# Patient Record
Sex: Male | Born: 1950 | Race: Black or African American | Hispanic: No | Marital: Married | Smoking: Former smoker
Health system: Southern US, Community
[De-identification: ages and names within clinical notes are randomized; demographics above are authoritative.]

## PROBLEM LIST (undated history)

## (undated) DIAGNOSIS — Z87442 Personal history of urinary calculi: Secondary | ICD-10-CM

## (undated) DIAGNOSIS — I10 Essential (primary) hypertension: Secondary | ICD-10-CM

## (undated) DIAGNOSIS — N200 Calculus of kidney: Secondary | ICD-10-CM

## (undated) DIAGNOSIS — N189 Chronic kidney disease, unspecified: Secondary | ICD-10-CM

## (undated) DIAGNOSIS — M479 Spondylosis, unspecified: Secondary | ICD-10-CM

## (undated) DIAGNOSIS — D126 Benign neoplasm of colon, unspecified: Secondary | ICD-10-CM

## (undated) DIAGNOSIS — C160 Malignant neoplasm of cardia: Secondary | ICD-10-CM

## (undated) DIAGNOSIS — E785 Hyperlipidemia, unspecified: Secondary | ICD-10-CM

## (undated) DIAGNOSIS — K219 Gastro-esophageal reflux disease without esophagitis: Secondary | ICD-10-CM

## (undated) HISTORY — DX: Malignant neoplasm of cardia: C16.0

## (undated) HISTORY — DX: Essential (primary) hypertension: I10

## (undated) HISTORY — DX: Spondylosis, unspecified: M47.9

## (undated) HISTORY — DX: Calculus of kidney: N20.0

## (undated) HISTORY — PX: KNEE SURGERY: SHX244

## (undated) HISTORY — DX: Benign neoplasm of colon, unspecified: D12.6

## (undated) HISTORY — DX: Hyperlipidemia, unspecified: E78.5

---

## 2002-03-11 ENCOUNTER — Ambulatory Visit (HOSPITAL_COMMUNITY): Admission: RE | Admit: 2002-03-11 | Discharge: 2002-03-11 | Payer: Self-pay | Admitting: *Deleted

## 2002-03-11 ENCOUNTER — Encounter (INDEPENDENT_AMBULATORY_CARE_PROVIDER_SITE_OTHER): Payer: Self-pay | Admitting: Specialist

## 2002-03-20 ENCOUNTER — Ambulatory Visit (HOSPITAL_COMMUNITY): Admission: RE | Admit: 2002-03-20 | Discharge: 2002-03-20 | Payer: Self-pay | Admitting: Gastroenterology

## 2002-04-20 ENCOUNTER — Encounter (INDEPENDENT_AMBULATORY_CARE_PROVIDER_SITE_OTHER): Payer: Self-pay | Admitting: Specialist

## 2002-04-20 ENCOUNTER — Inpatient Hospital Stay (HOSPITAL_COMMUNITY): Admission: RE | Admit: 2002-04-20 | Discharge: 2002-04-28 | Payer: Self-pay | Admitting: General Surgery

## 2002-04-20 ENCOUNTER — Encounter: Payer: Self-pay | Admitting: General Surgery

## 2002-04-20 HISTORY — PX: ESOPHAGOGASTRECTOMY: SHX1528

## 2002-04-21 ENCOUNTER — Encounter: Payer: Self-pay | Admitting: General Surgery

## 2002-04-22 ENCOUNTER — Encounter: Payer: Self-pay | Admitting: Thoracic Surgery

## 2002-04-23 ENCOUNTER — Encounter: Payer: Self-pay | Admitting: Thoracic Surgery

## 2002-04-24 ENCOUNTER — Encounter: Payer: Self-pay | Admitting: Thoracic Surgery

## 2002-04-25 ENCOUNTER — Encounter: Payer: Self-pay | Admitting: Thoracic Surgery

## 2002-04-26 ENCOUNTER — Encounter: Payer: Self-pay | Admitting: General Surgery

## 2002-04-27 ENCOUNTER — Encounter: Payer: Self-pay | Admitting: Thoracic Surgery

## 2002-04-28 ENCOUNTER — Encounter: Payer: Self-pay | Admitting: Thoracic Surgery

## 2002-05-12 ENCOUNTER — Encounter: Admission: RE | Admit: 2002-05-12 | Discharge: 2002-05-12 | Payer: Self-pay | Admitting: Thoracic Surgery

## 2002-05-12 ENCOUNTER — Encounter: Payer: Self-pay | Admitting: Thoracic Surgery

## 2002-06-16 ENCOUNTER — Ambulatory Visit: Admission: RE | Admit: 2002-06-16 | Discharge: 2002-08-08 | Payer: Self-pay | Admitting: *Deleted

## 2002-07-22 ENCOUNTER — Encounter: Admission: RE | Admit: 2002-07-22 | Discharge: 2002-07-22 | Payer: Self-pay | Admitting: Thoracic Surgery

## 2002-07-22 ENCOUNTER — Encounter: Payer: Self-pay | Admitting: Thoracic Surgery

## 2002-10-16 ENCOUNTER — Ambulatory Visit (HOSPITAL_COMMUNITY): Admission: RE | Admit: 2002-10-16 | Discharge: 2002-10-16 | Payer: Self-pay | Admitting: *Deleted

## 2002-10-16 ENCOUNTER — Encounter: Payer: Self-pay | Admitting: *Deleted

## 2002-10-20 ENCOUNTER — Encounter: Payer: Self-pay | Admitting: Thoracic Surgery

## 2002-10-20 ENCOUNTER — Encounter: Admission: RE | Admit: 2002-10-20 | Discharge: 2002-10-20 | Payer: Self-pay | Admitting: Thoracic Surgery

## 2002-12-24 ENCOUNTER — Ambulatory Visit (HOSPITAL_COMMUNITY): Admission: RE | Admit: 2002-12-24 | Discharge: 2002-12-24 | Payer: Self-pay | Admitting: General Surgery

## 2002-12-24 ENCOUNTER — Encounter: Payer: Self-pay | Admitting: General Surgery

## 2002-12-30 ENCOUNTER — Ambulatory Visit (HOSPITAL_COMMUNITY): Admission: RE | Admit: 2002-12-30 | Discharge: 2002-12-30 | Payer: Self-pay | Admitting: *Deleted

## 2002-12-30 ENCOUNTER — Encounter (INDEPENDENT_AMBULATORY_CARE_PROVIDER_SITE_OTHER): Payer: Self-pay | Admitting: Specialist

## 2003-01-01 ENCOUNTER — Encounter: Payer: Self-pay | Admitting: *Deleted

## 2003-01-01 ENCOUNTER — Ambulatory Visit (HOSPITAL_COMMUNITY): Admission: RE | Admit: 2003-01-01 | Discharge: 2003-01-01 | Payer: Self-pay | Admitting: *Deleted

## 2003-04-28 ENCOUNTER — Encounter: Payer: Self-pay | Admitting: Family Medicine

## 2003-04-28 ENCOUNTER — Ambulatory Visit (HOSPITAL_COMMUNITY): Admission: RE | Admit: 2003-04-28 | Discharge: 2003-04-28 | Payer: Self-pay | Admitting: Family Medicine

## 2003-05-07 ENCOUNTER — Ambulatory Visit (HOSPITAL_COMMUNITY): Admission: RE | Admit: 2003-05-07 | Discharge: 2003-05-07 | Payer: Self-pay | Admitting: Family Medicine

## 2003-05-07 ENCOUNTER — Encounter: Payer: Self-pay | Admitting: Family Medicine

## 2003-05-20 ENCOUNTER — Ambulatory Visit (HOSPITAL_COMMUNITY): Admission: RE | Admit: 2003-05-20 | Discharge: 2003-05-20 | Payer: Self-pay | Admitting: Oncology

## 2003-05-20 ENCOUNTER — Encounter (HOSPITAL_COMMUNITY): Payer: Self-pay | Admitting: Oncology

## 2003-07-22 ENCOUNTER — Encounter (HOSPITAL_COMMUNITY): Payer: Self-pay | Admitting: Oncology

## 2003-07-22 ENCOUNTER — Ambulatory Visit (HOSPITAL_COMMUNITY): Admission: RE | Admit: 2003-07-22 | Discharge: 2003-07-22 | Payer: Self-pay | Admitting: Oncology

## 2004-01-12 ENCOUNTER — Ambulatory Visit (HOSPITAL_COMMUNITY): Admission: RE | Admit: 2004-01-12 | Discharge: 2004-01-12 | Payer: Self-pay | Admitting: Oncology

## 2004-01-21 ENCOUNTER — Ambulatory Visit (HOSPITAL_COMMUNITY): Admission: RE | Admit: 2004-01-21 | Discharge: 2004-01-21 | Payer: Self-pay | Admitting: *Deleted

## 2004-01-21 ENCOUNTER — Encounter (INDEPENDENT_AMBULATORY_CARE_PROVIDER_SITE_OTHER): Payer: Self-pay | Admitting: Specialist

## 2004-09-15 ENCOUNTER — Ambulatory Visit: Payer: Self-pay | Admitting: Oncology

## 2004-12-18 ENCOUNTER — Ambulatory Visit: Payer: Self-pay | Admitting: Oncology

## 2005-03-12 ENCOUNTER — Ambulatory Visit: Payer: Self-pay | Admitting: Oncology

## 2005-03-13 ENCOUNTER — Ambulatory Visit (HOSPITAL_COMMUNITY): Admission: RE | Admit: 2005-03-13 | Discharge: 2005-03-13 | Payer: Self-pay | Admitting: Oncology

## 2005-07-04 ENCOUNTER — Ambulatory Visit: Payer: Self-pay | Admitting: Oncology

## 2005-11-05 ENCOUNTER — Ambulatory Visit: Payer: Self-pay | Admitting: Oncology

## 2006-03-06 ENCOUNTER — Ambulatory Visit: Payer: Self-pay | Admitting: Oncology

## 2006-03-12 LAB — CBC WITH DIFFERENTIAL/PLATELET
BASO%: 1.1 % (ref 0.0–2.0)
Basophils Absolute: 0 10*3/uL (ref 0.0–0.1)
EOS%: 1.5 % (ref 0.0–7.0)
Eosinophils Absolute: 0 10*3/uL (ref 0.0–0.5)
HCT: 43.9 % (ref 38.7–49.9)
HGB: 15.2 g/dL (ref 13.0–17.1)
LYMPH%: 18.4 % (ref 14.0–48.0)
MCH: 31.2 pg (ref 28.0–33.4)
MCHC: 34.6 g/dL (ref 32.0–35.9)
MCV: 90.3 fL (ref 81.6–98.0)
MONO#: 0.4 10*3/uL (ref 0.1–0.9)
MONO%: 12 % (ref 0.0–13.0)
NEUT#: 2.3 10*3/uL (ref 1.5–6.5)
NEUT%: 67 % (ref 40.0–75.0)
Platelets: 230 10*3/uL (ref 145–400)
RBC: 4.87 10*6/uL (ref 4.20–5.71)
RDW: 13.9 % (ref 11.2–14.6)
WBC: 3.4 10*3/uL — ABNORMAL LOW (ref 4.0–10.0)
lymph#: 0.6 10*3/uL — ABNORMAL LOW (ref 0.9–3.3)

## 2006-03-12 LAB — COMPREHENSIVE METABOLIC PANEL
ALT: 14 U/L (ref 0–40)
AST: 18 U/L (ref 0–37)
Albumin: 4.3 g/dL (ref 3.5–5.2)
Alkaline Phosphatase: 92 U/L (ref 39–117)
BUN: 13 mg/dL (ref 6–23)
CO2: 27 mEq/L (ref 19–32)
Calcium: 8.9 mg/dL (ref 8.4–10.5)
Chloride: 103 mEq/L (ref 96–112)
Creatinine, Ser: 1.15 mg/dL (ref 0.40–1.50)
Glucose, Bld: 88 mg/dL (ref 70–99)
Potassium: 4.5 mEq/L (ref 3.5–5.3)
Sodium: 138 mEq/L (ref 135–145)
Total Bilirubin: 1.5 mg/dL — ABNORMAL HIGH (ref 0.3–1.2)
Total Protein: 6.2 g/dL (ref 6.0–8.3)

## 2006-03-12 LAB — LACTATE DEHYDROGENASE: LDH: 160 U/L (ref 94–250)

## 2006-03-19 ENCOUNTER — Ambulatory Visit (HOSPITAL_COMMUNITY): Admission: RE | Admit: 2006-03-19 | Discharge: 2006-03-19 | Payer: Self-pay | Admitting: Oncology

## 2006-04-03 ENCOUNTER — Ambulatory Visit (HOSPITAL_COMMUNITY): Admission: RE | Admit: 2006-04-03 | Discharge: 2006-04-03 | Payer: Self-pay | Admitting: Oncology

## 2006-04-23 ENCOUNTER — Encounter: Payer: Self-pay | Admitting: Gastroenterology

## 2006-07-12 ENCOUNTER — Ambulatory Visit: Payer: Self-pay | Admitting: Oncology

## 2006-10-08 DIAGNOSIS — C160 Malignant neoplasm of cardia: Secondary | ICD-10-CM

## 2006-10-08 HISTORY — DX: Malignant neoplasm of cardia: C16.0

## 2006-11-07 ENCOUNTER — Ambulatory Visit: Payer: Self-pay | Admitting: Oncology

## 2006-11-12 LAB — CBC WITH DIFFERENTIAL/PLATELET
BASO%: 0.6 % (ref 0.0–2.0)
Basophils Absolute: 0 10*3/uL (ref 0.0–0.1)
EOS%: 1.2 % (ref 0.0–7.0)
Eosinophils Absolute: 0 10*3/uL (ref 0.0–0.5)
HCT: 44.2 % (ref 38.7–49.9)
HGB: 15.6 g/dL (ref 13.0–17.1)
LYMPH%: 21.3 % (ref 14.0–48.0)
MCH: 31.4 pg (ref 28.0–33.4)
MCHC: 35.4 g/dL (ref 32.0–35.9)
MCV: 88.5 fL (ref 81.6–98.0)
MONO#: 0.3 10*3/uL (ref 0.1–0.9)
MONO%: 8.9 % (ref 0.0–13.0)
NEUT#: 2.4 10*3/uL (ref 1.5–6.5)
NEUT%: 68 % (ref 40.0–75.0)
Platelets: 218 10*3/uL (ref 145–400)
RBC: 4.99 10*6/uL (ref 4.20–5.71)
RDW: 13.2 % (ref 11.2–14.6)
WBC: 3.6 10*3/uL — ABNORMAL LOW (ref 4.0–10.0)
lymph#: 0.8 10*3/uL — ABNORMAL LOW (ref 0.9–3.3)

## 2006-11-12 LAB — COMPREHENSIVE METABOLIC PANEL
ALT: 12 U/L (ref 0–53)
AST: 13 U/L (ref 0–37)
Albumin: 4.5 g/dL (ref 3.5–5.2)
Alkaline Phosphatase: 90 U/L (ref 39–117)
BUN: 17 mg/dL (ref 6–23)
CO2: 24 mEq/L (ref 19–32)
Calcium: 9.6 mg/dL (ref 8.4–10.5)
Chloride: 103 mEq/L (ref 96–112)
Creatinine, Ser: 1.46 mg/dL (ref 0.40–1.50)
Glucose, Bld: 129 mg/dL — ABNORMAL HIGH (ref 70–99)
Potassium: 4 mEq/L (ref 3.5–5.3)
Sodium: 140 mEq/L (ref 135–145)
Total Bilirubin: 0.8 mg/dL (ref 0.3–1.2)
Total Protein: 6.8 g/dL (ref 6.0–8.3)

## 2006-11-12 LAB — LACTATE DEHYDROGENASE: LDH: 144 U/L (ref 94–250)

## 2007-04-04 ENCOUNTER — Ambulatory Visit: Payer: Self-pay | Admitting: Oncology

## 2007-04-08 ENCOUNTER — Ambulatory Visit (HOSPITAL_COMMUNITY): Admission: RE | Admit: 2007-04-08 | Discharge: 2007-04-08 | Payer: Self-pay | Admitting: Oncology

## 2007-04-08 LAB — COMPREHENSIVE METABOLIC PANEL
ALT: 13 U/L (ref 0–53)
AST: 15 U/L (ref 0–37)
Albumin: 4.7 g/dL (ref 3.5–5.2)
Alkaline Phosphatase: 86 U/L (ref 39–117)
BUN: 17 mg/dL (ref 6–23)
CO2: 29 mEq/L (ref 19–32)
Calcium: 9.5 mg/dL (ref 8.4–10.5)
Chloride: 102 mEq/L (ref 96–112)
Creatinine, Ser: 1.21 mg/dL (ref 0.40–1.50)
Glucose, Bld: 86 mg/dL (ref 70–99)
Potassium: 4 mEq/L (ref 3.5–5.3)
Sodium: 139 mEq/L (ref 135–145)
Total Bilirubin: 1.1 mg/dL (ref 0.3–1.2)
Total Protein: 6.8 g/dL (ref 6.0–8.3)

## 2007-04-08 LAB — CBC WITH DIFFERENTIAL/PLATELET
BASO%: 0.7 % (ref 0.0–2.0)
Basophils Absolute: 0 10*3/uL (ref 0.0–0.1)
EOS%: 1 % (ref 0.0–7.0)
Eosinophils Absolute: 0 10*3/uL (ref 0.0–0.5)
HCT: 45.5 % (ref 38.7–49.9)
HGB: 16 g/dL (ref 13.0–17.1)
LYMPH%: 18.8 % (ref 14.0–48.0)
MCH: 31.5 pg (ref 28.0–33.4)
MCHC: 35.2 g/dL (ref 32.0–35.9)
MCV: 89.6 fL (ref 81.6–98.0)
MONO#: 0.4 10*3/uL (ref 0.1–0.9)
MONO%: 11.3 % (ref 0.0–13.0)
NEUT#: 2.5 10*3/uL (ref 1.5–6.5)
NEUT%: 68.2 % (ref 40.0–75.0)
Platelets: 221 10*3/uL (ref 145–400)
RBC: 5.08 10*6/uL (ref 4.20–5.71)
RDW: 13.1 % (ref 11.2–14.6)
WBC: 3.7 10*3/uL — ABNORMAL LOW (ref 4.0–10.0)
lymph#: 0.7 10*3/uL — ABNORMAL LOW (ref 0.9–3.3)

## 2007-04-08 LAB — LACTATE DEHYDROGENASE: LDH: 152 U/L (ref 94–250)

## 2007-05-08 ENCOUNTER — Encounter: Payer: Self-pay | Admitting: Gastroenterology

## 2007-06-19 ENCOUNTER — Encounter: Payer: Self-pay | Admitting: Gastroenterology

## 2007-06-30 ENCOUNTER — Encounter: Payer: Self-pay | Admitting: Gastroenterology

## 2007-10-08 ENCOUNTER — Ambulatory Visit: Payer: Self-pay | Admitting: Oncology

## 2007-10-14 LAB — CBC WITH DIFFERENTIAL/PLATELET
BASO%: 1.6 % (ref 0.0–2.0)
Basophils Absolute: 0.1 10*3/uL (ref 0.0–0.1)
EOS%: 1.3 % (ref 0.0–7.0)
Eosinophils Absolute: 0.1 10*3/uL (ref 0.0–0.5)
HCT: 43.7 % (ref 38.7–49.9)
HGB: 15.4 g/dL (ref 13.0–17.1)
LYMPH%: 21 % (ref 14.0–48.0)
MCH: 31.5 pg (ref 28.0–33.4)
MCHC: 35.4 g/dL (ref 32.0–35.9)
MCV: 88.9 fL (ref 81.6–98.0)
MONO#: 0.4 10*3/uL (ref 0.1–0.9)
MONO%: 10.8 % (ref 0.0–13.0)
NEUT#: 2.6 10*3/uL (ref 1.5–6.5)
NEUT%: 65.3 % (ref 40.0–75.0)
Platelets: 237 10*3/uL (ref 145–400)
RBC: 4.91 10*6/uL (ref 4.20–5.71)
RDW: 13.4 % (ref 11.2–14.6)
WBC: 4 10*3/uL (ref 4.0–10.0)
lymph#: 0.9 10*3/uL (ref 0.9–3.3)

## 2007-10-14 LAB — COMPREHENSIVE METABOLIC PANEL
ALT: 20 U/L (ref 0–53)
AST: 17 U/L (ref 0–37)
Albumin: 4.6 g/dL (ref 3.5–5.2)
Alkaline Phosphatase: 92 U/L (ref 39–117)
BUN: 17 mg/dL (ref 6–23)
CO2: 26 mEq/L (ref 19–32)
Calcium: 9.6 mg/dL (ref 8.4–10.5)
Chloride: 103 mEq/L (ref 96–112)
Creatinine, Ser: 1.2 mg/dL (ref 0.40–1.50)
Glucose, Bld: 86 mg/dL (ref 70–99)
Potassium: 4.5 mEq/L (ref 3.5–5.3)
Sodium: 141 mEq/L (ref 135–145)
Total Bilirubin: 0.6 mg/dL (ref 0.3–1.2)
Total Protein: 6.9 g/dL (ref 6.0–8.3)

## 2007-10-14 LAB — LACTATE DEHYDROGENASE: LDH: 152 U/L (ref 94–250)

## 2007-10-14 LAB — PSA, MEDICARE: PSA: 1.56 ng/mL (ref 0.10–4.00)

## 2008-04-07 ENCOUNTER — Ambulatory Visit: Payer: Self-pay | Admitting: Oncology

## 2008-04-12 LAB — CBC WITH DIFFERENTIAL/PLATELET
BASO%: 0.6 % (ref 0.0–2.0)
Basophils Absolute: 0 10*3/uL (ref 0.0–0.1)
EOS%: 2.6 % (ref 0.0–7.0)
Eosinophils Absolute: 0.1 10*3/uL (ref 0.0–0.5)
HCT: 43.4 % (ref 38.7–49.9)
HGB: 15.4 g/dL (ref 13.0–17.1)
LYMPH%: 22.6 % (ref 14.0–48.0)
MCH: 31.1 pg (ref 28.0–33.4)
MCHC: 35.3 g/dL (ref 32.0–35.9)
MCV: 88.1 fL (ref 81.6–98.0)
MONO#: 0.4 10*3/uL (ref 0.1–0.9)
MONO%: 9.9 % (ref 0.0–13.0)
NEUT#: 2.3 10*3/uL (ref 1.5–6.5)
NEUT%: 64.3 % (ref 40.0–75.0)
Platelets: 227 10*3/uL (ref 145–400)
RBC: 4.93 10*6/uL (ref 4.20–5.71)
RDW: 13.7 % (ref 11.2–14.6)
WBC: 3.6 10*3/uL — ABNORMAL LOW (ref 4.0–10.0)
lymph#: 0.8 10*3/uL — ABNORMAL LOW (ref 0.9–3.3)

## 2008-04-12 LAB — COMPREHENSIVE METABOLIC PANEL
ALT: 15 U/L (ref 0–53)
AST: 17 U/L (ref 0–37)
Albumin: 4.4 g/dL (ref 3.5–5.2)
Alkaline Phosphatase: 80 U/L (ref 39–117)
BUN: 19 mg/dL (ref 6–23)
CO2: 26 mEq/L (ref 19–32)
Calcium: 9.3 mg/dL (ref 8.4–10.5)
Chloride: 103 mEq/L (ref 96–112)
Creatinine, Ser: 1.18 mg/dL (ref 0.40–1.50)
Glucose, Bld: 87 mg/dL (ref 70–99)
Potassium: 4.5 mEq/L (ref 3.5–5.3)
Sodium: 138 mEq/L (ref 135–145)
Total Bilirubin: 1 mg/dL (ref 0.3–1.2)
Total Protein: 6.4 g/dL (ref 6.0–8.3)

## 2008-04-12 LAB — LACTATE DEHYDROGENASE: LDH: 157 U/L (ref 94–250)

## 2008-04-21 ENCOUNTER — Ambulatory Visit (HOSPITAL_COMMUNITY): Admission: RE | Admit: 2008-04-21 | Discharge: 2008-04-21 | Payer: Self-pay | Admitting: Oncology

## 2008-04-22 ENCOUNTER — Encounter: Payer: Self-pay | Admitting: Gastroenterology

## 2008-04-26 ENCOUNTER — Encounter: Payer: Self-pay | Admitting: Gastroenterology

## 2008-04-29 ENCOUNTER — Encounter: Admission: RE | Admit: 2008-04-29 | Discharge: 2008-04-29 | Payer: Self-pay | Admitting: Gastroenterology

## 2008-04-29 ENCOUNTER — Encounter: Payer: Self-pay | Admitting: Gastroenterology

## 2008-05-12 ENCOUNTER — Encounter: Payer: Self-pay | Admitting: Gastroenterology

## 2008-05-18 ENCOUNTER — Encounter: Payer: Self-pay | Admitting: Gastroenterology

## 2008-05-19 ENCOUNTER — Encounter: Payer: Self-pay | Admitting: Gastroenterology

## 2008-05-19 DIAGNOSIS — Z8501 Personal history of malignant neoplasm of esophagus: Secondary | ICD-10-CM | POA: Insufficient documentation

## 2008-06-03 ENCOUNTER — Ambulatory Visit (HOSPITAL_COMMUNITY): Admission: RE | Admit: 2008-06-03 | Discharge: 2008-06-03 | Payer: Self-pay | Admitting: Gastroenterology

## 2008-06-03 ENCOUNTER — Ambulatory Visit: Payer: Self-pay | Admitting: Gastroenterology

## 2008-06-26 ENCOUNTER — Emergency Department: Payer: Self-pay | Admitting: Unknown Physician Specialty

## 2008-06-26 ENCOUNTER — Other Ambulatory Visit: Payer: Self-pay

## 2008-11-16 ENCOUNTER — Ambulatory Visit: Payer: Self-pay | Admitting: Oncology

## 2009-05-17 ENCOUNTER — Ambulatory Visit: Payer: Self-pay | Admitting: Oncology

## 2009-05-19 LAB — CBC WITH DIFFERENTIAL/PLATELET
BASO%: 1.4 % (ref 0.0–2.0)
Basophils Absolute: 0.1 10*3/uL (ref 0.0–0.1)
EOS%: 1.5 % (ref 0.0–7.0)
Eosinophils Absolute: 0.1 10*3/uL (ref 0.0–0.5)
HCT: 42.6 % (ref 38.4–49.9)
HGB: 14.7 g/dL (ref 13.0–17.1)
LYMPH%: 22.6 % (ref 14.0–49.0)
MCH: 31.5 pg (ref 27.2–33.4)
MCHC: 34.5 g/dL (ref 32.0–36.0)
MCV: 91.1 fL (ref 79.3–98.0)
MONO#: 0.4 10*3/uL (ref 0.1–0.9)
MONO%: 11.7 % (ref 0.0–14.0)
NEUT#: 2.2 10*3/uL (ref 1.5–6.5)
NEUT%: 62.8 % (ref 39.0–75.0)
Platelets: 213 10*3/uL (ref 140–400)
RBC: 4.68 10*6/uL (ref 4.20–5.82)
RDW: 13.6 % (ref 11.0–14.6)
WBC: 3.5 10*3/uL — ABNORMAL LOW (ref 4.0–10.3)
lymph#: 0.8 10*3/uL — ABNORMAL LOW (ref 0.9–3.3)

## 2009-05-19 LAB — LACTATE DEHYDROGENASE: LDH: 176 U/L (ref 94–250)

## 2009-05-19 LAB — COMPREHENSIVE METABOLIC PANEL
ALT: 16 U/L (ref 0–53)
AST: 16 U/L (ref 0–37)
Albumin: 4.2 g/dL (ref 3.5–5.2)
Alkaline Phosphatase: 79 U/L (ref 39–117)
BUN: 16 mg/dL (ref 6–23)
CO2: 25 mEq/L (ref 19–32)
Calcium: 8.7 mg/dL (ref 8.4–10.5)
Chloride: 109 mEq/L (ref 96–112)
Creatinine, Ser: 1.27 mg/dL (ref 0.40–1.50)
Glucose, Bld: 95 mg/dL (ref 70–99)
Potassium: 4.3 mEq/L (ref 3.5–5.3)
Sodium: 142 mEq/L (ref 135–145)
Total Bilirubin: 1 mg/dL (ref 0.3–1.2)
Total Protein: 6.5 g/dL (ref 6.0–8.3)

## 2009-11-16 ENCOUNTER — Ambulatory Visit: Payer: Self-pay | Admitting: Oncology

## 2009-11-18 LAB — CBC WITH DIFFERENTIAL/PLATELET
BASO%: 0.6 % (ref 0.0–2.0)
Basophils Absolute: 0 10*3/uL (ref 0.0–0.1)
EOS%: 1.3 % (ref 0.0–7.0)
Eosinophils Absolute: 0.1 10*3/uL (ref 0.0–0.5)
HCT: 44.7 % (ref 38.4–49.9)
HGB: 15.4 g/dL (ref 13.0–17.1)
LYMPH%: 22.9 % (ref 14.0–49.0)
MCH: 31.7 pg (ref 27.2–33.4)
MCHC: 34.4 g/dL (ref 32.0–36.0)
MCV: 92.3 fL (ref 79.3–98.0)
MONO#: 0.4 10*3/uL (ref 0.1–0.9)
MONO%: 9.9 % (ref 0.0–14.0)
NEUT#: 2.6 10*3/uL (ref 1.5–6.5)
NEUT%: 65.3 % (ref 39.0–75.0)
Platelets: 215 10*3/uL (ref 140–400)
RBC: 4.84 10*6/uL (ref 4.20–5.82)
RDW: 13.9 % (ref 11.0–14.6)
WBC: 4 10*3/uL (ref 4.0–10.3)
lymph#: 0.9 10*3/uL (ref 0.9–3.3)

## 2009-11-18 LAB — COMPREHENSIVE METABOLIC PANEL
ALT: 19 U/L (ref 0–53)
AST: 19 U/L (ref 0–37)
Albumin: 4.5 g/dL (ref 3.5–5.2)
Alkaline Phosphatase: 85 U/L (ref 39–117)
BUN: 12 mg/dL (ref 6–23)
CO2: 27 mEq/L (ref 19–32)
Calcium: 9.2 mg/dL (ref 8.4–10.5)
Chloride: 104 mEq/L (ref 96–112)
Creatinine, Ser: 1.24 mg/dL (ref 0.40–1.50)
Glucose, Bld: 91 mg/dL (ref 70–99)
Potassium: 4.5 mEq/L (ref 3.5–5.3)
Sodium: 139 mEq/L (ref 135–145)
Total Bilirubin: 0.9 mg/dL (ref 0.3–1.2)
Total Protein: 6.6 g/dL (ref 6.0–8.3)

## 2009-11-18 LAB — LACTATE DEHYDROGENASE: LDH: 150 U/L (ref 94–250)

## 2009-12-14 ENCOUNTER — Ambulatory Visit (HOSPITAL_COMMUNITY): Admission: RE | Admit: 2009-12-14 | Discharge: 2009-12-14 | Payer: Self-pay | Admitting: Gastroenterology

## 2010-04-13 ENCOUNTER — Ambulatory Visit (HOSPITAL_BASED_OUTPATIENT_CLINIC_OR_DEPARTMENT_OTHER): Admission: RE | Admit: 2010-04-13 | Discharge: 2010-04-13 | Payer: Self-pay | Admitting: General Surgery

## 2010-05-16 ENCOUNTER — Ambulatory Visit: Payer: Self-pay | Admitting: Oncology

## 2010-05-18 ENCOUNTER — Ambulatory Visit (HOSPITAL_COMMUNITY): Admission: RE | Admit: 2010-05-18 | Discharge: 2010-05-18 | Payer: Self-pay | Admitting: Oncology

## 2010-05-18 LAB — CBC WITH DIFFERENTIAL/PLATELET
BASO%: 0.7 % (ref 0.0–2.0)
Basophils Absolute: 0 10*3/uL (ref 0.0–0.1)
EOS%: 1.3 % (ref 0.0–7.0)
Eosinophils Absolute: 0 10*3/uL (ref 0.0–0.5)
HCT: 40.5 % (ref 38.4–49.9)
HGB: 13.9 g/dL (ref 13.0–17.1)
LYMPH%: 17.4 % (ref 14.0–49.0)
MCH: 31.4 pg (ref 27.2–33.4)
MCHC: 34.4 g/dL (ref 32.0–36.0)
MCV: 91.3 fL (ref 79.3–98.0)
MONO#: 0.4 10*3/uL (ref 0.1–0.9)
MONO%: 12 % (ref 0.0–14.0)
NEUT#: 2.5 10*3/uL (ref 1.5–6.5)
NEUT%: 68.6 % (ref 39.0–75.0)
Platelets: 202 10*3/uL (ref 140–400)
RBC: 4.43 10*6/uL (ref 4.20–5.82)
RDW: 13.8 % (ref 11.0–14.6)
WBC: 3.7 10*3/uL — ABNORMAL LOW (ref 4.0–10.3)
lymph#: 0.6 10*3/uL — ABNORMAL LOW (ref 0.9–3.3)

## 2010-05-18 LAB — COMPREHENSIVE METABOLIC PANEL
ALT: 14 U/L (ref 0–53)
AST: 17 U/L (ref 0–37)
Albumin: 4.3 g/dL (ref 3.5–5.2)
Alkaline Phosphatase: 77 U/L (ref 39–117)
BUN: 15 mg/dL (ref 6–23)
CO2: 25 mEq/L (ref 19–32)
Calcium: 9.2 mg/dL (ref 8.4–10.5)
Chloride: 109 mEq/L (ref 96–112)
Creatinine, Ser: 1.21 mg/dL (ref 0.40–1.50)
Glucose, Bld: 106 mg/dL — ABNORMAL HIGH (ref 70–99)
Potassium: 4.3 mEq/L (ref 3.5–5.3)
Sodium: 142 mEq/L (ref 135–145)
Total Bilirubin: 0.8 mg/dL (ref 0.3–1.2)
Total Protein: 6.1 g/dL (ref 6.0–8.3)

## 2010-05-18 LAB — LACTATE DEHYDROGENASE: LDH: 166 U/L (ref 94–250)

## 2010-06-06 IMAGING — RF DG ESOPHAGUS
18 of 24 series · 18 of 24 positions shown · non-contrast
Comparison: [REDACTED] CT chest 04/21/2008 and chest x-rays 04/08/2007.
And [REDACTED] esophagram report 04/25/2002.

CLINICAL DATA: Esophageal/stomach cancer 5115 with previous
esophagogastrectomy. Nexium medication.  Dysphasia and chest pain
while eating.

BARIUM SWALLOW / ESOPHAGRAM
TECHNIQUE: Double and single contrast barium swallow with the
administration of 13 mm barium tablet with water.  Fluoro time
minutes.

[Series 1: run · 1 of 3 slices shown (1 of 18)]
[im 1/3]
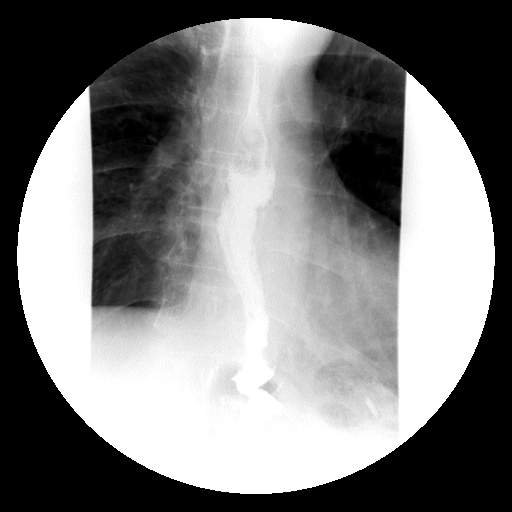

[Series 3: run · 1 of 7 slices shown (2 of 18)]
[im 1/7]
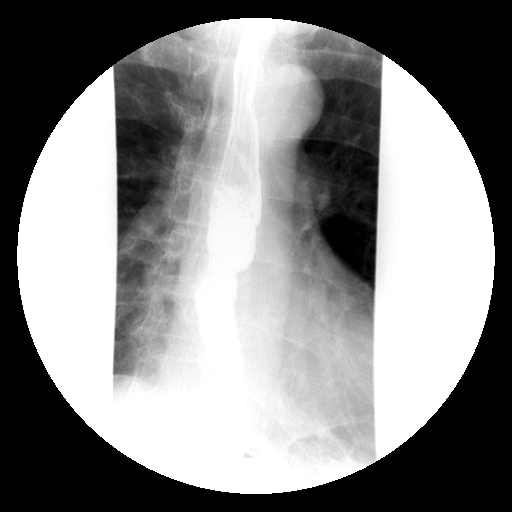

[Series 4: run · 1 of 1 slices shown (3 of 18)]
[im 1/1]
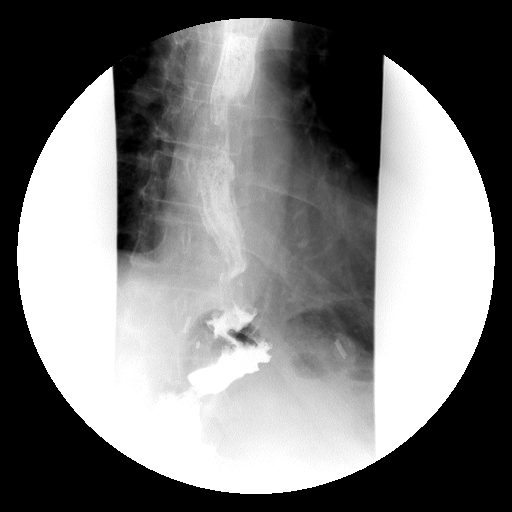

[Series 5: run · 1 of 3 slices shown (4 of 18)]
[im 1/3]
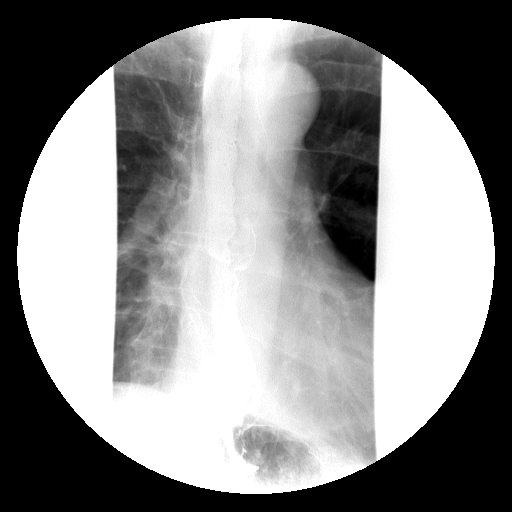

[Series 7: run · 1 of 4 slices shown (5 of 18)]
[im 1/4]
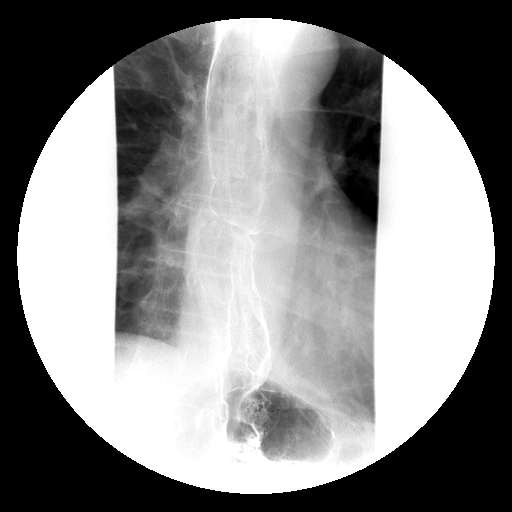

[Series 8: run · 1 of 12 slices shown (6 of 18)]
[im 1/12]
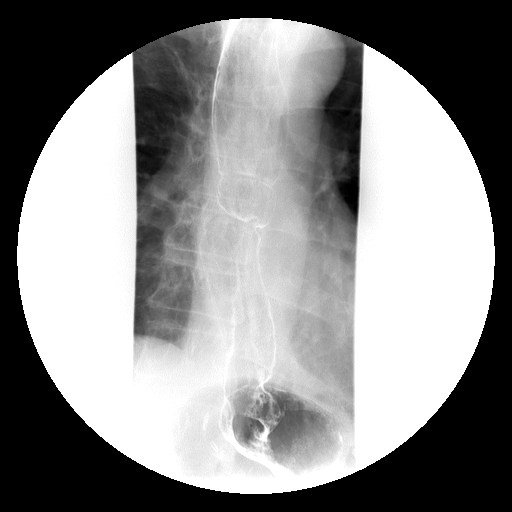

[Series 9: run · 1 of 1 slices shown (7 of 18)]
[im 1/1]
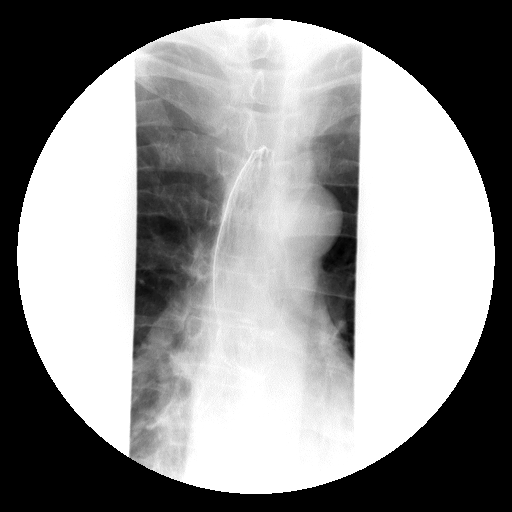

[Series 11: run · 1 of 4 slices shown (8 of 18)]
[im 1/4]
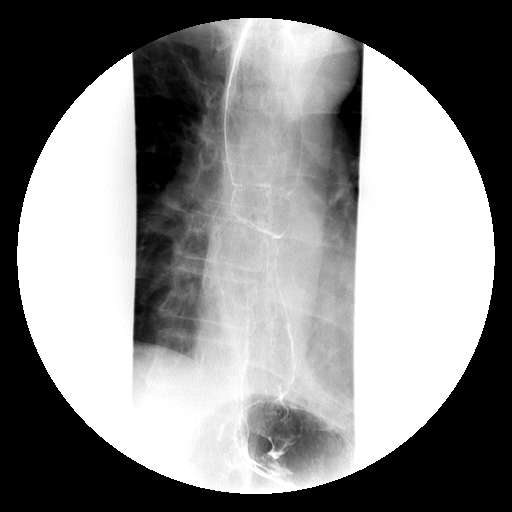

[Series 12: run · 1 of 2 slices shown (9 of 18)]
[im 1/2]
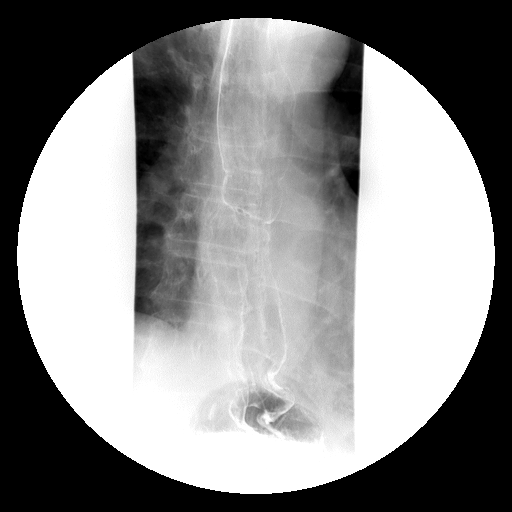

[Series 13: run · 1 of 6 slices shown (10 of 18)]
[im 1/6]
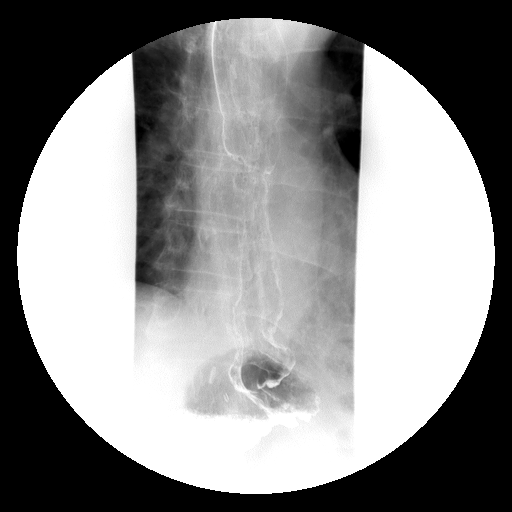

[Series 15: run · 1 of 1 slices shown (11 of 18)]
[im 1/1]
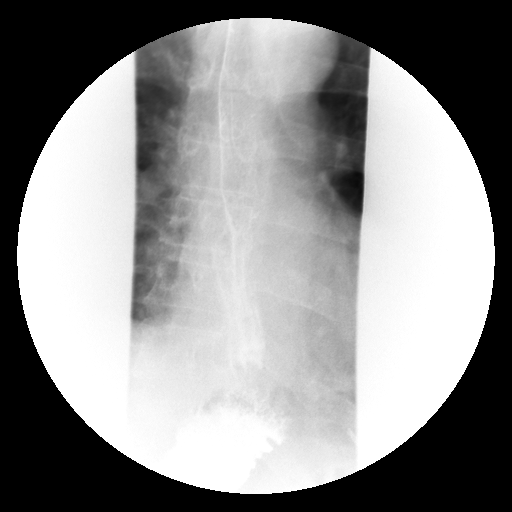

[Series 16: run · 1 of 6 slices shown (12 of 18)]
[im 1/6]
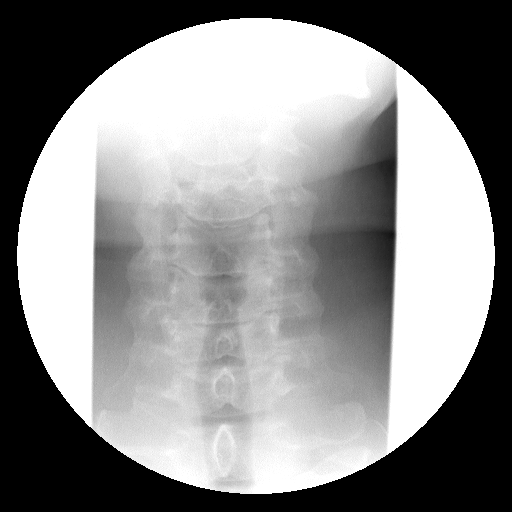

[Series 17: run · 1 of 2 slices shown (13 of 18)]
[im 1/2]
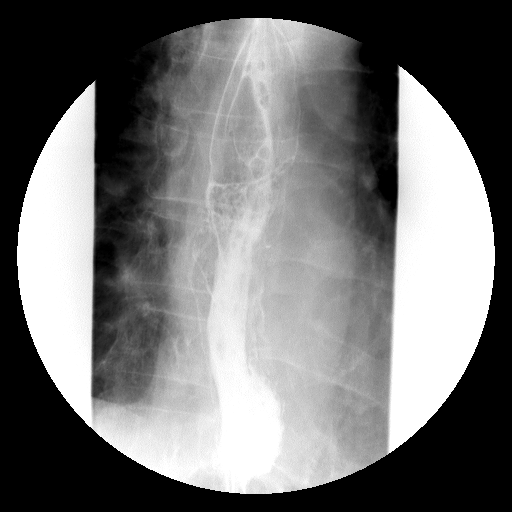

[Series 19: run · 1 of 6 slices shown (14 of 18)]
[im 1/6]
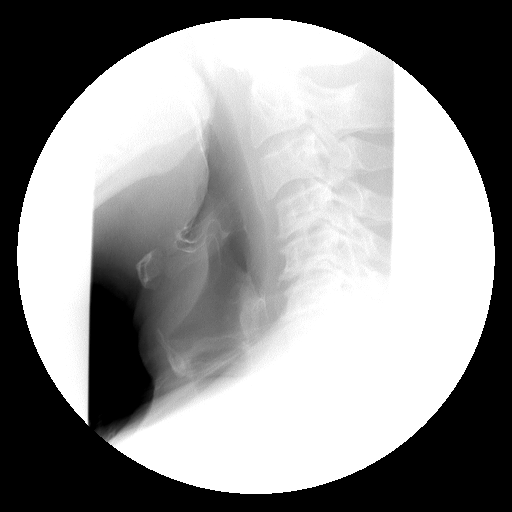

[Series 20: run · 1 of 4 slices shown (15 of 18)]
[im 1/4]
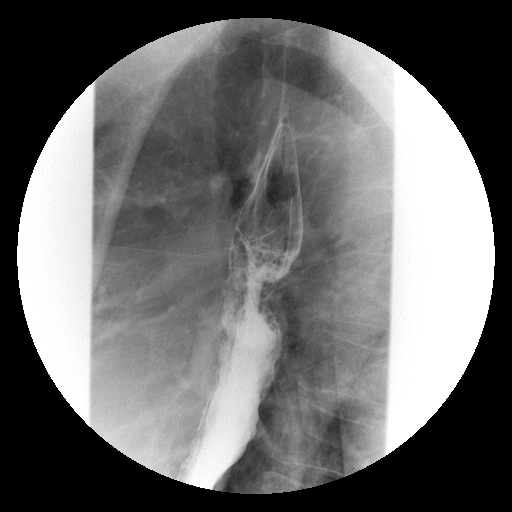

[Series 21: run · 1 of 11 slices shown (16 of 18)]
[im 1/11]
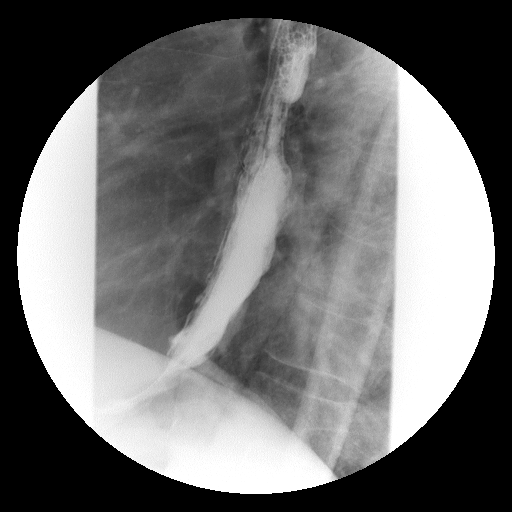

[Series 23: run · 1 of 1 slices shown (17 of 18)]
[im 1/1]
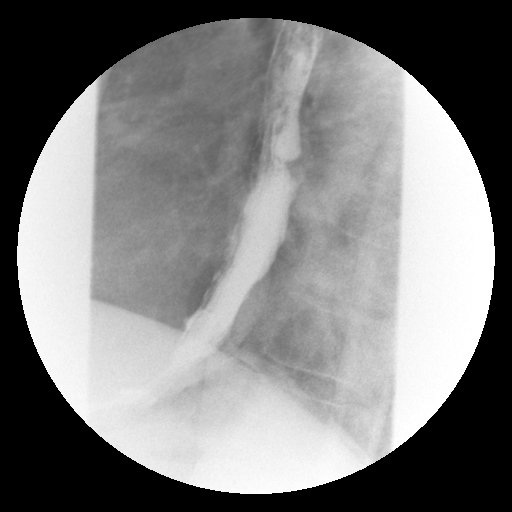

[Series 24: run · 1 of 4 slices shown (18 of 18)]
[im 1/4]
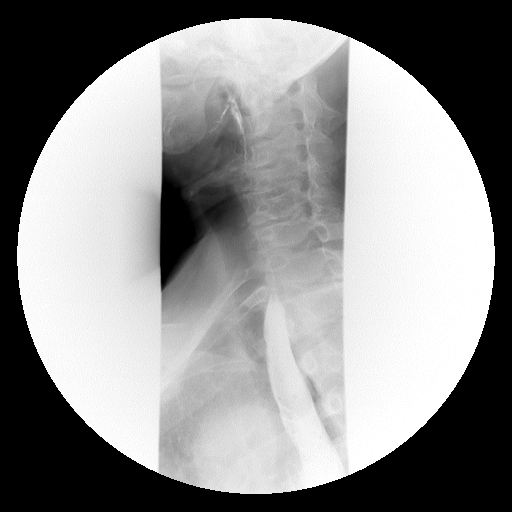

[18 of 24 positions shown; findings below may reference images not displayed]

FINDINGS: Essentially normal antegrade peristalsis is seen through
the cervical and superior thoracic esophagus.  Dysmotility with
stasis especially with prone and supine positioning is seen at the
mid to distal thoracic esophagus and esophagogastrectomy segment.
At the anastomosis no obstruction or significant fibrotic scarring
visualized.  Ingested 13 mm barium tablet in upright position
readily passed into the stomach. In the region of surgical clips at
the diaphragmatic hiatus region is persistent filling defect and
slight mucosal irregularity at the anterior and left lateral
gastric portion.  Recommend endoscopy and possible biopsy for
further evaluation to exclude possible gastric cancer tumor
recurrence versus inflammatory change in this region.  No
obstruction to the barium seen as a result of this finding.  No
spontaneous or induced (Valsalva/water siphon) gastroesophageal
reflux was currently demonstrated.  No gastric outlet obstruction
is seen with prompt egress of barium from stomach into the duodenum
and jejunum.  Degenerative disc disease and anterior spondylosis
maximal at C6-7 and lesser C4-5 through C7-T1.
IMPRESSION: 1.  Persistent nonobstructing filling defect and mucosal
irregularity at the anterior and left lateral gastric wall at the
level of surgical clips (diaphragmatic hiatus).  Recommend
endoscopy and possible biopsy to further assess possible neoplasm
versus hypertrophic inflammatory change in this region.
2. Esophagogastrectomy anastomosis appears patent.
3.  Dysmotility at the mid to distal esophagus and
esophagogastrectomy segment with stasis of liquid barium especially
in the prone and supine positioning.
4.  Degenerative disc vertebral changes maximal at C6-7 and lesser
C4-5 through C7-T1.

## 2010-11-07 NOTE — Procedures (Signed)
Summary: EUS   EUS  Procedure date:  06/03/2008  Findings:      Location: [facility]    Patient Name: Ethan Dyer, Ethan Dyer MRN: [id number] Procedure Procedures: Panendoscopy with EUSCPT: 43259.   with Dilation Personnel: Endoscopist: Rachael Fee, MD. Radiologist: Charlott Rakes, MD.  Exam Location: Exam performed in Endoscopy Suite. Outpatient  Patient Consent: Procedure, Alternatives, Risks and Benefits discussed, consent obtained, from patient. Consent was obtained by the RN.  Indications  Assessment: h/o esophageal adenocarcinoma, nodular/edematous anastomosis, odynophagia that has been present since his esophagectomy but has been getting worse lately, +dysphagsia: CT and PET scan recently show no evidence of recurrence, metastasis although the EG anastomosis was slightly PET avid.  History  Current Medications: Patient is not currently taking Coumadin.  Allergies: No known allergies.  Patient Habits Patient does not smoke. Drinking Status: occasional.  Pre-Exam Physical: Performed Jun 03, 2008. Cardio-pulmonary exam, Extremity exam, Mental status exam WNL.  Comments: Pt. history reviewed/updated, physical exam performed prior to initiation of sedation? yes Exam Exam: Images were taken.  Patient: ASA Classification: II. Tolerance: good.  Sedation Meds:  ~OBJECTIVE5Sedation Meds Patient assessed and found to be appropriate for moderate (conscious) sedation. Fentanyl 125 mcg. given IV. Versed 10 mg. given IV.  Monitoring: BP and pulse monitoring done. Oximetry was used. Supplemental O2 given.  EUS Scopes: Radial Echoendoscope used   Comments: Endoscopic findings: 1. Nodularity at previous EG anastomosis, most recent biopsies 7/09 showed no neoplasia.  Barrett's appearing mucosa proximal to the EG anastomosis.  The lumen at the anastomosis is slightly narrowed (down to 14-49mm) and so given his dysphagia balloon dilation was performed with  a CRE TTS balloon held inflated to 2cm for 1 minute. 2. Normal stomach.  EUS findings: 1. EG anastomosis was irregular, but no obvious masses, lesions suspicious for recurrent malignancy.  Endosonographic layers of proximal stomach and distal esophagus were normal. 2. No paraesophageal or celiac adenopathy.  Assessment: NODULARITY AT EG ANASTOMOSIS, BARRETT'S APPEARING MUCOSA FOR 1-2CM PROXIMAL TO THE ANASTOMOSIS.  NO SUSPICIOUS LESIONS, LAYERING OF BOWEL WALL BY EUS.  NO PARAESOPHAGEAL OR CELIAC ADENOPATHY.  HE'S BEEN HAVING DYSPHAGIA AND SO THE EG ANSTOMOSIS WAS DILATED UP TO 2CM WITH CRE BALLOON.  I WILL COMMUNICATE THESE FINDING WITH DR. Bosie Clos.  THERE ARE NO SIGNS OF RECURRENCE BY THIS EXAMINATION.

## 2010-11-17 ENCOUNTER — Encounter: Payer: Commercial Managed Care - PPO | Admitting: Oncology

## 2010-11-20 ENCOUNTER — Encounter (HOSPITAL_BASED_OUTPATIENT_CLINIC_OR_DEPARTMENT_OTHER): Payer: Medicare Other | Admitting: Oncology

## 2010-11-20 ENCOUNTER — Other Ambulatory Visit (HOSPITAL_COMMUNITY): Payer: Self-pay | Admitting: Oncology

## 2010-11-20 DIAGNOSIS — Z85028 Personal history of other malignant neoplasm of stomach: Secondary | ICD-10-CM

## 2010-11-20 DIAGNOSIS — C155 Malignant neoplasm of lower third of esophagus: Secondary | ICD-10-CM

## 2010-11-20 DIAGNOSIS — C160 Malignant neoplasm of cardia: Secondary | ICD-10-CM

## 2010-11-20 DIAGNOSIS — Z8501 Personal history of malignant neoplasm of esophagus: Secondary | ICD-10-CM

## 2010-11-20 LAB — CBC WITH DIFFERENTIAL/PLATELET
BASO%: 1.4 % (ref 0.0–2.0)
Basophils Absolute: 0 10*3/uL (ref 0.0–0.1)
EOS%: 0.8 % (ref 0.0–7.0)
Eosinophils Absolute: 0 10*3/uL (ref 0.0–0.5)
HCT: 45.2 % (ref 38.4–49.9)
HGB: 15.4 g/dL (ref 13.0–17.1)
LYMPH%: 20.1 % (ref 14.0–49.0)
MCH: 30.9 pg (ref 27.2–33.4)
MCHC: 34 g/dL (ref 32.0–36.0)
MCV: 90.8 fL (ref 79.3–98.0)
MONO#: 0.4 10*3/uL (ref 0.1–0.9)
MONO%: 12.6 % (ref 0.0–14.0)
NEUT#: 2.2 10*3/uL (ref 1.5–6.5)
NEUT%: 65.1 % (ref 39.0–75.0)
Platelets: 216 10*3/uL (ref 140–400)
RBC: 4.98 10*6/uL (ref 4.20–5.82)
RDW: 13.4 % (ref 11.0–14.6)
WBC: 3.4 10*3/uL — ABNORMAL LOW (ref 4.0–10.3)
lymph#: 0.7 10*3/uL — ABNORMAL LOW (ref 0.9–3.3)

## 2010-11-20 LAB — COMPREHENSIVE METABOLIC PANEL
ALT: 13 U/L (ref 0–53)
AST: 18 U/L (ref 0–37)
Albumin: 4.7 g/dL (ref 3.5–5.2)
Alkaline Phosphatase: 94 U/L (ref 39–117)
BUN: 13 mg/dL (ref 6–23)
CO2: 28 mEq/L (ref 19–32)
Calcium: 9.7 mg/dL (ref 8.4–10.5)
Chloride: 101 mEq/L (ref 96–112)
Creatinine, Ser: 1.23 mg/dL (ref 0.40–1.50)
Glucose, Bld: 94 mg/dL (ref 70–99)
Potassium: 4.3 mEq/L (ref 3.5–5.3)
Sodium: 138 mEq/L (ref 135–145)
Total Bilirubin: 1 mg/dL (ref 0.3–1.2)
Total Protein: 6.6 g/dL (ref 6.0–8.3)

## 2010-11-20 LAB — LACTATE DEHYDROGENASE: LDH: 161 U/L (ref 94–250)

## 2010-12-24 LAB — POCT HEMOGLOBIN-HEMACUE: Hemoglobin: 15.1 g/dL (ref 13.0–17.0)

## 2011-02-23 NOTE — Op Note (Signed)
[place]. [facility]  Patient:    Ethan Dyer, Ethan Dyer Visit Number: [id number] MRN: [id number]          Service Type: SUR Location: 2300 2312 01 Attending Physician:  Brandy Hale Dictated by:   Angelia Mould. Derrell Lolling, M.D. Proc. Date: 04/20/02 Admit Date:  04/20/2002   CC:         Roosvelt Harps, M.D.  Robert A. Eliezer Lofts., M.D.  Venita Lick. Pleas Koch., M.D. [facility]  D. Karle Plumber, M.D.   Operative Report  PREOPERATIVE DIAGNOSIS:  Adenocarcinoma of the gastroesophageal junction.  POSTOPERATIVE DIAGNOSIS:  Adenocarcinoma of the gastroesophageal junction.  PROCEDURES: 1. Esophagogastrectomy (Ivor-Lewis). 2. Feeding jejunostomy (#16 French red Roxan Hockey).  CO-SURGEONS:  Angelia Mould. Derrell Lolling, M.D., and D. Karle Plumber, M.D.  OPERATIVE INDICATIONS:  This is a 60 year old black man who has developed some dysphagia and vomiting over the past year.  He has lost about 15 pounds. Upper GI shows small filling defect within the distal lumen of the esophagus. Upper endoscopy showed a submucosal nodule at the GE junction about 15 mm in size, and a biopsy showed moderate to poorly-differentiated adenocarcinoma.  I was asked to see him.  Staging workup revealed negative CT of the chest, abdomen, and pelvis.  Negative PET scan.  Endoscopic ultrasound by Dr. Russella Dar suggested this was a T2 lesion.  Dr. Edwyna Shell and I conferred about this and felt that the next step in the patients care was exploratory laparotomy and esophagogastrectomy.  The patient was brought to the operating room electively.  OPERATIVE FINDINGS:  The patient had a tumor of the distal esophagus and the gastric cardia.  This appeared at least 3 cm in diameter but grossly did appear to be a T2 lesion and did not appear to have invaded the serosa.  There were no grossly enlarged lymph nodes.  The liver felt fine.  We found no liver lesions.  Omentum and retroperitoneum felt normal.  The spleen  looked and felt normal.  DESCRIPTION OF PROCEDURE:  Following the induction of general endotracheal anesthesia, the patient was positioned with his hips supine and his left shoulder rolled anteriorly with his arm draped across his forehead with proper cushioning.  Chest and abdomen were prepped and draped in a sterile fashion. Midline laparotomy was performed from the xiphoid down to just below the umbilicus.  The fascia was incised in the midline and the abdominal cavity was entered and explored with findings as described above.  The self-retaining retractors were placed.  We mobilized the gastroesophageal junction by dividing the gastrohepatic ligament.  We then dissected the right and left crura away from the intra-abdominal esophagus.  A Penrose drain was placed around the distal esophagus.  We could palpate a tumor at this location.  The duodenum was generously mobilized with a fairly extensive Kocher maneuver.  We mobilized the greater curvature of the stomach.  We preserved the right gastroepiploic vessel.  Gastrocolic omentum was divided between clamps and ligated with 2-0 silk ties.  We took down all the short gastrics between clamps and ligated these with 2-0 silk ties.  We further mobilized the posterior wall of the stomach off the retroperitoneum.  We then isolated the coronary vein, clamped it, divided it, and ligated it with 2-0 silk ties.  We then isolated the left gastric artery between clamps, divided it, and suture ligated it doubly with 2-0 silk suture ligatures.  This completely mobilized the stomach from the GE junction all the way down to the  distal antrum.  During this part of the procedure Dr. Karle Plumber performed a left anterolateral thoracotomy and mobilized the distal thoracic esophagus.  He could feel the most superior aspect of the tumor and was able to get several centimeters above this.  There was no intrathoracic adenopathy.  He will dictate this  separately.  Pyloroplasty was then performed.  We placed stay sutures of 3-0 silk in the pylorus superiorly and inferiorly.  We divided the pylorus longitudinally and closed it transversely with interrupted inverting sutures of 3-0 silk.  This provided a nice closure of the pyloroplasty but allowed at least a 2 cm opening.  There was no bleeding from this site.  The operative field superiorly was irrigated with saline.  There was no bleeding from the spleen or any of the other dissection areas.  Dr. Edwyna Shell transected the esophagus in the chest, having brought the esophagus and the stomach down into the abdominal cavity.  We marked out the tumor along the gastric cardia.  We marked the stomach so that we could get at least a 3 cm margin away from the distal aspect of the tumor.  The stomach was then divided with a GIA stapling device.  The GIA stapler was a 75 mm stapler and it was fired three separate times, creating a long gastric tube.  The esophagogastrectomy specimen was sent to pathology.  The proximal and distal margins were looked at with frozen section, and they were negative for tumor.  The staple line on the stomach, which was essentially along the orientation of the lesser curve, was closed with a running inverting suture of 3-0 Prolene.  We chose to create an anastomosis between the fundus of the gastric tube and the end of the thoracic esophagus with a 28 mm Autosuture EEA stapling device. Dr. Edwyna Shell placed a pursestring suture in the distal esophagus with a Prolene suture.  The 28 mm anvil was then inserted and the pursestring suture tied, and that looked fine.  I performed an anterior gastrotomy in the proximal antrum of the stomach and passed the 28 mm EEA stapler through the gastrotomy up into the fundus of the  stomach, which had been passed through the hiatus between the right and the left crura.  We very carefully positioned the stapler so that it would not fire  and disrupt any of the previous sutures.  We opened the spike of the stapler through the gastric wall.  We removed the spike.  We secured the anvil to the stapler.  We oriented everything very carefully, making sure there was no twisting.  We then closed the stapler, fired it, opened it, and removed it. We had two complete doughnuts of tissue, which were sent for routine histology.  Dr. Edwyna Shell reinforced the thoracic anastomosis with some further interrupted sutures, which he will dictate.  The operative field was irrigated.  I sutured the stomach to the right and left crura with about eight or 10 interrupted sutures to prevent herniation. The gastrotomy was then closed in two layers.  The inner layer was a running Connell suture of 3-0 Vicryl and the outer layer was interrupted Lembert sutures of 3-0 silk.  The stomach lay quite nicely within the abdomen.  The gastrotomy closure looked nice, and the pyloroplasty looked nice.  We irrigated this area thoroughly.  We performed a feeding jejunostomy.  We selected a suitable piece of jejunum approximately 12-16 inches distal to the ligament of Treitz.  A 16 French red Robinson catheter  was brought to the operative field, and extra side holes were cut and the end was cut off.  A small hole was made in the antimesenteric wall of the jejunum with the cautery, and the catheter was inserted and it flushed nicely.  A pursestring suture of 3-0 silk was placed around this to secure it.  The catheter was then secured to the jejunum with a suture of 3-0 chromic, which was tied around the catheter.  A Witzel tunnel was created with about five or six interrupted Lembert sutures of 3-0 silk.  The jejunostomy catheter was then brought through a stab wound in the left abdominal wall. The jejunum was tacked to the peritoneal surface with about 12 interrupted sutures of 3-0 silk.  Great care was taken to place this in smooth orientation so that it would not  twist or kink.  The catheter again flushed well.  We sutured the jejunostomy catheter to the skin with a 0 silk tie.  We examined all the areas in the abdomen and found no bleeding.  The midline fascia was closed with a running suture of #1 PDS and the skin closed with skin staples.  Dr. Edwyna Shell will dictate the thoracic procedure and the closure of the thoracotomy and placement of chest tubes.  Estimated blood loss was probably around 500 cc.  Complications:  None. Sponge, needle, and instrument counts were correct. Dictated by:   Angelia Mould. Derrell Lolling, M.D. Attending Physician:  Brandy Hale DD:  04/20/02 TD:  04/22/02 Job: [id number]

## 2011-02-23 NOTE — Op Note (Signed)
[name]  Patient:    Ethan Dyer, VERDELL Visit Number: [id number] MRN: [id number]          Service Type: SUR Location: 2300 2312 01 Attending Physician:  Brandy Hale Dictated by:   Edwin Cap. Zoila Shutter, M.D. Proc. Date: 04/20/02 Admit Date:  04/20/2002   CC:         Angelia Mould. Derrell Lolling, M.D.  D. Karle Plumber, M.D.   Operative Report  PROCEDURE PERFORMED:  ANESTHESIOLOGIST:  Edwin Cap. Zoila Shutter, M.D.  HISTORY:  The patient is a 60 year old African-American male with diagnosis of carcinoma of the esophagus scheduled for esophagogastrectomy under general anesthesia and for whom epidural analgesia in the postoperative period has been requested as part of medical management.  DESCRIPTION OF PROCEDURE:  At the termination of surgery while still under general anesthesia, Mr. Shugart was turned to the right lateral decubitus position.  His back was prepped with Betadine and draped in the usual sterile fashion.  Using midline approach a peridural tap was accomplished at the T10-T11 interspace with a 17 Tuohy needle using loss of resistance technique. After aspiration for blood and CSF were negative, a total of 7 cc of 1% lidocaine with 100 mcg of fentanyl was infused with no problems.  This was followed by the passage of a peridural catheter 15 cm cephalad.  The catheter was then checked for patency and affixed to the patients back.  The patient was then returned to the supine position and brought to the post anesthesia care unit where his peridural catheter will be connected to an infusion pump with a mixture of fentanyl 5 mcg per cc at an initial rate of 12 cc per hour to be adjusted as needed.  There were no complications.  He did well and will be followed in the usual fashion. Dictated by:   Edwin Cap. Zoila Shutter, M.D. Attending Physician:  Brandy Hale DD:  04/20/02 TD:  04/22/02 Job: [id number]

## 2011-02-23 NOTE — Consult Note (Signed)
[place]. [facility]  Patient:    Ethan Dyer, Ethan Dyer Visit Number: [id number] MRN: [id number]          Service Type: SUR Location: 3300 3307 01 Attending Physician:  Brandy Hale Dictated by:   Lorette Ang, N.P. Proc. Date: 04/23/02 Admit Date:  04/20/2002   CC:         Norton Blizzard, M.D.  Angelia Mould. Derrell Lolling, M.D.  Roosvelt Harps, M.D.  [facility]   Consultation Report  REASON FOR CONSULTATION:  Adenocarcinoma GE junction.  REFERRING PHYSICIANS:  Drs. Edwyna Shell and Derrell Lolling.  HISTORY OF PRESENT ILLNESS:  The patient is a 60 year old gentleman who was found to have a 15 mm nodule in the distal esophagus by EGD on March 11, 2002 after he presented with a several-month history of dysphagia.  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  Endoscopic ultrasound on March 20, 2002 revealed a T2, N0 lesion involving the cardia and distal esophagus.  CTs of the chest, abdomen, and pelvis were negative except for a small sliding hiatal hernia and benign bone islands in the L1 vertebral body and left iliac wing. PET scan done at [facility] on March 19, 2002 revealed mildly increased activity in the region of the GE junction. There was no evidence of metastatic disease.  On April 20, 2002, the patient underwent esophagogastrectomy (Ivor-Lewis) and jejunostomy tube placement by Drs. Billie Ruddy.  Final pathology [id number]) showed an invasive poorly differentiated adenocarcinoma arising in the esophagogastric junction with extension through full thickness of the muscularis propia to ink serosal surface, involvement of 1 of 7 perigastric lymph nodes and negative mucosal margins every section (T3, N1, M0).  PAST MEDICAL HISTORY: 1. Hypertension. 2. Hypercholesterolemia. 3. Status post left knee  arthroscopy.  CURRENT MEDICATIONS: 1. Albuterol nebulizers q.6h. 2. Dulcolax 10 mg b.i.d. 3. Cefotan 1 g q.12h. 4. Reglan 10 mg q.6h. 5. Osmolite tube feed. 6. Protonix 40 mg daily.  ALLERGIES:  No known drug allergies.  FAMILY HISTORY:  Father has history of hypertension; mother is healthy; the patient has no siblings.  SOCIAL HISTORY:  The patient lives in [place] with his girlfriend of 17 years.  He has one son who is 5 years old and reported to be healthy.  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  He reports a remote history of tobacco use as a teenager.  He reports ETOH intake at 2 beers approximately three times per week.  REVIEW OF SYSTEMS:  The patient reports an 16-pound weight loss since January 2003.  His appetite has been good.  His energy level has been good.  He denies any fever.  He has had no unusual headaches or vision changes.  He denies any shortness of breath or cough.  He has had no chest pain.  He denies any peripheral edema.  He reports dysphagia since January 2003.  He denies any odynophagia.  He has had no recent change in his bowel habits and denies any rectal bleeding.  He has had no hematuria or dysuria.  PHYSICAL EXAMINATION:  VITAL SIGNS:  Temperature 97.9, heart rate 92, respirations 24, blood pressure 120/64, oxygen saturation 94% on 2 L.  GENERAL:  Pleasant African-American male resting in a recliner.  HEENT:  Normocephalic, atraumatic.  Extraocular movements are intact; sclerae are anicteric.  Oropharynx is clear.  NECK:  No cervical adenopathy.  CHEST:  Lungs are clear  anteriorly.  Left chest tube x2 intact.  CARDIOVASCULAR:  Regular rate and rhythm.  ABDOMEN:  J-tube; midline gauze dressing.  EXTREMITIES:  No cyanosis, clubbing, or edema.  NEUROLOGIC:  Alert and oriented x3.  LABORATORY DATA:  Hemoglobin 13.8, white count 10.1, platelets 208,000. Sodium 134, potassium 3.7, BUN 11, creatinine 1.3, glucose  125, calcium 8.5.  PREOPERATIVE LABORATORY WORK:  Sodium 140, potassium 3.9, BUN 12, creatinine 1.2, glucose 99.  Total bilirubin 1.1, alkaline phosphatase 63, SGOT 22, SGPT 31, total protein 6.3, albumin 3.8, calcium 9.2.  Hemoglobin 14.6, white count 5.3, platelets 216,000.  RADIOLOGY: 1. Chest CT - no mediastinal or hilar adenopathy.  No masses. 2. CT of the abdomen and pelvis - small sliding hiatal hernia.  Liver and    spleen normal.  Pancreas and adrenals negative.  Tiny cyst lower pole right    kidney.  Benign bone islands at one vertebral body in left iliac wing. 3. PET scan at [facility] - mildly increased activity in the region of the GE    junction.  Negative for metastatic disease.  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  We recommend combined modality therapy with 5FU/leucovorin and radiation therapy.  We will arrange for a followup appointment at the [facility] in approximately 2 weeks.  We have asked radiation-oncology to evaluate as well.  The patient seen and examined by Dr. Katrinka Blazing. Dictated by:   Lorette Ang, N.P. Attending Physician:  Brandy Hale DD:  04/23/02 TD:  04/27/02 Job: [id number]

## 2011-02-23 NOTE — Op Note (Signed)
[facility]  Patient:    CLANCE, BAQUERO Visit Number: [id number] MRN: [id number]          Service Type: SUR Location: 2300 [id number] Attending Physician:  Brandy Hale Dictated by:   D. Karle Plumber, M.D. Admit Date:  04/20/2002   CC:         Norton Blizzard, M.D. x 2  [facility]. Derrell Lolling, M.D.  Roosvelt Harps, M.D.   Operative Report  PREOPERATIVE DIAGNOSIS:  Adenocarcinoma of the distal esophagus.  POSTOPERATIVE DIAGNOSIS:  Adenocarcinoma of the distal esophagus.  OPERATION PERFORMED:  Esophagogastrectomy, left thoracotomy, and exploratory laparotomy.  SURGEONS:  Norton Blizzard, M.D., and Claud Kelp, M.D.  FIRST ASSISTANT:  Cherly Anderson, C.R.N.F.A.  DESCRIPTION OF PROCEDURE:  Dr. Derrell Lolling did the abdominal part and I did the chest part.  I will dictate the chest pain and Dr. Derrell Lolling will be dictating the abdominal part.  Briefly, after a ______ tube had been inserted, the patient was turned 45 degrees and was prepped and draped in a sterile manner. An incision was made in the abdomen and dissection was carried down to the right of the umbilicus.  The abdomen was entered and exploration was carried out, and the cancer was found to be at the GE junction and the distal esophagus.  It appeared to be confined there.  No other nodes were seen.  A Kocher maneuver was done by Dr. Derrell Lolling and then dissection up of the lesser, greater curvature, and then the lesser curvature was freed up.  Finally, the left gastrics were divided by Dr. Derrell Lolling freeing up the stomach.  A self-retaining retractor was placed.  An incision was made in the sixth intercostal space on the left side and dissection was carried down to the subcutaneous tissue dividing the latissimus, dividing the serratus anterior and entering the sixth intercostal space.  A ______ was placed in the intercostal space.  The inferior pulmonary ligament was taken down and  then dissection was started down around the esophagus and the esophagus was dissected out starting at the inferior pulmonary ligament and inferior pulmonary vein down to the hiatus.  The hiatus was dissected free from above and the esophagus was dissected free circumferentially.  After the abdominal part had been completed and the left gastrics divided, the stomach was completely free and the esophagus was divided about 3 to 4 cm below the inferior pulmonary vein.  Margins were sent and were found to be negative on the esophagus.  Then, the stomach was divided along the greater curvature and then dissected up to the lesser curvature, getting at least 3 to 5 cm margins from the gross tumor.  Again, frozen sections of the distal margins were also negative.  After this had been done, the margin of the stomach was oversewn with 3-0 Prolene.  Then a pyloroplasty was done in the usual fashion, a Heineke-Mikulicz type pyloroplasty, by Dr. Derrell Lolling.  The stomach was pulled into the chest, a gastrotomy was made, and the EEA 28 mm was placed in the stomach.  Then, a pursestring suture was placed around the distal esophagus and the 28 mm anvil was placed in the distal esophagus and the pursestring was tied down.  After this had been done, the anvil was placed onto the EEA which had been brought through the proximal stomach, ______ the EEA to the distal stomach with the spear being out and moving the spear, and attaching the anvil to the stomach.  The  EEA was clamped down and fired and divided which left a good donut ring.  The EEA was removed.  The gastrotomy was closed by Dr. Derrell Lolling and running 3-0 Vicryl, and interrupted 2-0 silk.  Lembert sutures were placed around the anastomosis in the chest reinforcing the anastomosis with interrupted 3-0 silk.  Two chest tubes were brought into separate stab wounds and placed on each side of the anastomosis and sutured in place with 4-0 chromic.  The jejunostomy  was done by Dr. Derrell Lolling and then the abdomen was closed by Dr. Derrell Lolling.  The chest was closed with two chromic pericostals, #1 Vicryl in the muscle layers, 2-0 Vicryl in the subcutaneous tissues.  The lung had been re-expanded.  A Marcaine block was done in the usual fashion and the ______ catheters were inserted underneath the pericostals bringing the catheters medially and laterally through sheaths, removing the peel-away sheath and placing the catheters under the pericostals for Marcaine administration.  The subcutaneous tissues were run with 3-0 Vicryl and the skin with 3-0 ______  Patient was returned to the intensive care unit in serious but stable condition. Dictated by:   D. Karle Plumber, M.D. Attending Physician:  Brandy Hale DD:  04/20/02 TD:  04/21/02 Job: [id number]

## 2011-02-23 NOTE — Discharge Summary (Signed)
NAMEMONT, JAGODA                          ACCOUNT NO.:  [id number]   MEDICAL RECORD NO.:  [id number]                   PATIENT TYPE:  INP   LOCATION:  3307                                 FACILITY:  [facility]   PHYSICIAN:  Angelia Mould. Derrell Lolling, M.D.             DATE OF BIRTH:  1951-07-27   DATE OF ADMISSION:  04/20/2002  DATE OF DISCHARGE:  04/28/2002                                 DISCHARGE SUMMARY   FINAL DIAGNOSIS:  1. 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  2. Hypertension.   OPERATION PERFORMED:  Esophagogastrectomy (Ivor Lewis approach), feeding  jejunostomy.   DATE OF SURGERY:  04/20/2002.   HISTORY:  This is a 60 year old black man who developed dysphagia and some  vomiting and a 15-pound weight loss over one year.  An upper GI showed a  filling defect in the distal esophagus.  An upper endoscopy by Dr. Luther Parody  on March 11, 2002 showed a nodule at the esophageal gastric junction felt to  be about 15 mm in diameter.  Biopsies showed poorly differentiated  adenocarcinoma.  The rest of the endoscopy was normal.  I was asked to see  him at that point.  The patient had full staging with CT scanning and PET  scanning and there was no sign of any tumor other than at the  esophagogastric junction.  I asked Dr. Karle Plumber to work with me on  this case and surgery was scheduled.  The patient was admitted electively  for a resection.   PAST MEDICAL HISTORY:  Significant for hypertension, elevated cholesterol,  and a left knee arthroscopy.   CURRENT MEDICATIONS:  Nexium, Pravachol, Allegra, and Accuretic.   ALLERGIES:  None.   PHYSICAL EXAMINATION:  GENERAL:  A pleasant, middle-aged black man in no  distress.  NECK:  Revealed no mass.  LUNGS:  Clear to auscultation.  HEART:  Regular rate and rhythm.  ABDOMEN:  Soft, nontender.  No mass.  EXTREMITIES:  No edema, no adenopathy.   HOSPITAL COURSE:  On the day of  admission, the patient was taken to the  operating room.  He underwent exploratory laparotomy and a left thoracotomy.  We performed a resection of the distal esophagus and proximal stomach and  lesser curve with a primary EEA staple to anastomosis.  Pyloroplasty and  feeding jejunostomy was performed.  Dr. Edwyna Shell and I performed this as co-  surgeons.   PATHOLOGY:  Showed poorly differentiated adenocarcinoma of the  esophagogastric junction with extension through the full thickness of the  muscularis propria to the end-serosal surface.  Barrett's epithelium was  noted.  One out of 7 perigastric lymph nodes had metastatic adenocarcinoma.  Mucosal margins of the upper section were free of tumor.   EMERGENCY DEPARTMENT COURSE:  Postoperatively, the patient did very well.  He was managed in the intensive care unit and later on the  step-down unit.  Chest tubes were removed several days postop without much difficulty.  He  remained stable and had no bleeding or major pulmonary problems.  We began  jejunal tube feedings on the second postop day and advanced those without  any problem.  The Gastrografin swallow was performed on 04/25/2002 and looks  fine.  There was no leak and no obstruction.  We began a liquid diet at that  point.  We advanced his diet thereafter and he tolerated his diet very well  and we were able to actually wean him off of his jejunal tube feedings prior  to discharge.   He was seen in consultation by Dr. Aliene Altes, medical oncologist, who  stated that the patient should have combined chemotherapy and radiation  therapy as an outpatient to begin about one month postop.  Radiation  oncology also saw the patient and agreed with this plan.   We discharged the patient on 04/28/2002.  At that time he was afebrile,  tolerating a soft diet, was having bowel movements.  All of his wounds were  healing without any problems.  His chest x-ray looked good.  A followup with  me,  Dr. Edwyna Shell, Dr. Aliene Altes, and radiology oncology was arranged.  Jejunostomy tube was left in place to be flushed as an outpatient.                                                Angelia Mould. Derrell Lolling, M.D.    HMI/MEDQ  D:  05/17/2002  T:  05/21/2002  Job:  [id number]   cc:   Foye Deer, M.D.   Althea Grimmer. Luther Parody, M.D.   [facility]   Merilynn Finland, M.D.  [address] [place] - [facility]  [place]  [place] [address]  Fax: [phone]

## 2011-04-02 NOTE — Miscellaneous (Signed)
Summary: EUS/06-03-08  Clinical Lists Changes  Problems: Added new problem of PERSONAL HISTORY MALIGNANT NEOPLASM ESOPHAGUS (ICD-V10.03) Orders: Added new Test order of EUS-Upper (EUS-Upper) - Signed

## 2011-04-02 NOTE — Consult Note (Signed)
Summary: [facility]  [facility]   Imported By: Lanelle Bal 05/21/2008 13:20:19  _____________________________________________________________________  External Attachment:    Type:   Image     Comment:   External Document

## 2011-04-02 NOTE — Consult Note (Signed)
Summary: [facility]  [facility]   Imported By: Lanelle Bal 05/21/2008 13:18:30  _____________________________________________________________________  External Attachment:    Type:   Image     Comment:   External Document

## 2011-04-02 NOTE — Consult Note (Signed)
Summary: [facility]   Imported By: Lanelle Bal 05/21/2008 13:19:24  _____________________________________________________________________  External Attachment:    Type:   Image     Comment:   External Document

## 2011-04-02 NOTE — Letter (Signed)
Summary: Letter With Patient History & Need for Endoscopic Ultrasound/[facility]  Letter With Patient History & Need for Endoscopic Ultrasound/[facility]   Imported By: Lanelle Bal 05/21/2008 13:13:04  _____________________________________________________________________  External Attachment:    Type:   Image     Comment:   External Document

## 2011-04-02 NOTE — Letter (Signed)
Summary: [facility]  [facility]   Imported By: Lanelle Bal 05/21/2008 13:16:22  _____________________________________________________________________  External Attachment:    Type:   Image     Comment:   External Document

## 2011-06-08 ENCOUNTER — Other Ambulatory Visit (HOSPITAL_COMMUNITY): Payer: Self-pay | Admitting: Oncology

## 2011-06-08 ENCOUNTER — Encounter (HOSPITAL_BASED_OUTPATIENT_CLINIC_OR_DEPARTMENT_OTHER): Payer: Medicare Other | Admitting: Oncology

## 2011-06-08 DIAGNOSIS — Z09 Encounter for follow-up examination after completed treatment for conditions other than malignant neoplasm: Secondary | ICD-10-CM

## 2011-06-08 DIAGNOSIS — Z85028 Personal history of other malignant neoplasm of stomach: Secondary | ICD-10-CM

## 2011-06-08 DIAGNOSIS — Z8501 Personal history of malignant neoplasm of esophagus: Secondary | ICD-10-CM

## 2011-06-08 LAB — CBC WITH DIFFERENTIAL/PLATELET
BASO%: 0.8 % (ref 0.0–2.0)
Basophils Absolute: 0 10*3/uL (ref 0.0–0.1)
EOS%: 1.4 % (ref 0.0–7.0)
Eosinophils Absolute: 0.1 10*3/uL (ref 0.0–0.5)
HCT: 43 % (ref 38.4–49.9)
HGB: 14.9 g/dL (ref 13.0–17.1)
LYMPH%: 20.7 % (ref 14.0–49.0)
MCH: 31.5 pg (ref 27.2–33.4)
MCHC: 34.7 g/dL (ref 32.0–36.0)
MCV: 90.9 fL (ref 79.3–98.0)
MONO#: 0.4 10*3/uL (ref 0.1–0.9)
MONO%: 11.9 % (ref 0.0–14.0)
NEUT#: 2.5 10*3/uL (ref 1.5–6.5)
NEUT%: 65.2 % (ref 39.0–75.0)
Platelets: 200 10*3/uL (ref 140–400)
RBC: 4.74 10*6/uL (ref 4.20–5.82)
RDW: 14 % (ref 11.0–14.6)
WBC: 3.8 10*3/uL — ABNORMAL LOW (ref 4.0–10.3)
lymph#: 0.8 10*3/uL — ABNORMAL LOW (ref 0.9–3.3)

## 2011-06-08 LAB — COMPREHENSIVE METABOLIC PANEL
ALT: 17 U/L (ref 0–53)
AST: 15 U/L (ref 0–37)
Albumin: 4.7 g/dL (ref 3.5–5.2)
Alkaline Phosphatase: 87 U/L (ref 39–117)
BUN: 16 mg/dL (ref 6–23)
CO2: 28 mEq/L (ref 19–32)
Calcium: 9.4 mg/dL (ref 8.4–10.5)
Chloride: 105 mEq/L (ref 96–112)
Creatinine, Ser: 1.44 mg/dL — ABNORMAL HIGH (ref 0.50–1.35)
Glucose, Bld: 94 mg/dL (ref 70–99)
Potassium: 4.9 mEq/L (ref 3.5–5.3)
Sodium: 141 mEq/L (ref 135–145)
Total Bilirubin: 0.8 mg/dL (ref 0.3–1.2)
Total Protein: 6.4 g/dL (ref 6.0–8.3)

## 2011-06-08 LAB — LACTATE DEHYDROGENASE: LDH: 166 U/L (ref 94–250)

## 2011-10-24 DIAGNOSIS — Z125 Encounter for screening for malignant neoplasm of prostate: Secondary | ICD-10-CM | POA: Diagnosis not present

## 2011-10-24 DIAGNOSIS — N410 Acute prostatitis: Secondary | ICD-10-CM | POA: Diagnosis not present

## 2011-10-24 DIAGNOSIS — Z0000 Encounter for general adult medical examination without abnormal findings: Secondary | ICD-10-CM | POA: Diagnosis not present

## 2011-11-17 ENCOUNTER — Telehealth: Payer: Self-pay | Admitting: Oncology

## 2011-11-17 NOTE — Telephone Encounter (Signed)
lmonvm advising the pt of his aug 2013 appts °

## 2012-03-06 ENCOUNTER — Other Ambulatory Visit: Payer: Self-pay | Admitting: Gastroenterology

## 2012-03-06 DIAGNOSIS — K228 Other specified diseases of esophagus: Secondary | ICD-10-CM | POA: Diagnosis not present

## 2012-03-06 DIAGNOSIS — K2289 Other specified disease of esophagus: Secondary | ICD-10-CM | POA: Diagnosis not present

## 2012-03-06 DIAGNOSIS — K229 Disease of esophagus, unspecified: Secondary | ICD-10-CM | POA: Diagnosis not present

## 2012-03-06 DIAGNOSIS — R12 Heartburn: Secondary | ICD-10-CM | POA: Diagnosis not present

## 2012-03-06 DIAGNOSIS — K219 Gastro-esophageal reflux disease without esophagitis: Secondary | ICD-10-CM | POA: Diagnosis not present

## 2012-03-06 DIAGNOSIS — Z8501 Personal history of malignant neoplasm of esophagus: Secondary | ICD-10-CM | POA: Diagnosis not present

## 2012-03-06 DIAGNOSIS — K319 Disease of stomach and duodenum, unspecified: Secondary | ICD-10-CM | POA: Diagnosis not present

## 2012-03-20 ENCOUNTER — Telehealth: Payer: Self-pay

## 2012-03-20 NOTE — Telephone Encounter (Signed)
Surgical path report received and forwarded to [facility]

## 2012-03-21 ENCOUNTER — Encounter: Payer: Self-pay | Admitting: Oncology

## 2012-03-21 NOTE — Progress Notes (Signed)
This patient's who has previously undergone multimodality treatment for an adenocarcinoma of the GE junction underwent an upper endoscopy by Dr. Charlott Rakes on 03/06/2012. Indication was heartburn and esophageal reflux. There was congestion of the esophageal mucosa. Biopsy of the gastroesophageal junction mucosa showed inflammation. There was no evidence of goblet cell metaplasia, dysplasia, or malignancy.

## 2012-06-05 ENCOUNTER — Other Ambulatory Visit (HOSPITAL_BASED_OUTPATIENT_CLINIC_OR_DEPARTMENT_OTHER): Payer: Medicare Other | Admitting: Lab

## 2012-06-05 ENCOUNTER — Encounter: Payer: Self-pay | Admitting: Family

## 2012-06-05 ENCOUNTER — Telehealth: Payer: Self-pay | Admitting: Oncology

## 2012-06-05 ENCOUNTER — Ambulatory Visit (HOSPITAL_COMMUNITY)
Admission: RE | Admit: 2012-06-05 | Discharge: 2012-06-05 | Disposition: A | Discharge status: Home / Self Care | Payer: Medicare Other | Source: Ambulatory Visit | Attending: Family | Admitting: Family

## 2012-06-05 ENCOUNTER — Ambulatory Visit (HOSPITAL_BASED_OUTPATIENT_CLINIC_OR_DEPARTMENT_OTHER): Payer: Medicare Other | Admitting: Family

## 2012-06-05 VITALS — BP 147/94 | HR 71 | Temp 98.9°F | Resp 18 | Ht 71.0 in | Wt 167.6 lb

## 2012-06-05 DIAGNOSIS — Z85028 Personal history of other malignant neoplasm of stomach: Secondary | ICD-10-CM

## 2012-06-05 DIAGNOSIS — R1310 Dysphagia, unspecified: Secondary | ICD-10-CM | POA: Diagnosis not present

## 2012-06-05 DIAGNOSIS — Z8501 Personal history of malignant neoplasm of esophagus: Secondary | ICD-10-CM | POA: Diagnosis not present

## 2012-06-05 DIAGNOSIS — R42 Dizziness and giddiness: Secondary | ICD-10-CM

## 2012-06-05 DIAGNOSIS — R0602 Shortness of breath: Secondary | ICD-10-CM | POA: Insufficient documentation

## 2012-06-05 LAB — CBC WITH DIFFERENTIAL/PLATELET
BASO%: 1.6 % (ref 0.0–2.0)
Basophils Absolute: 0.1 10*3/uL (ref 0.0–0.1)
EOS%: 1 % (ref 0.0–7.0)
Eosinophils Absolute: 0 10*3/uL (ref 0.0–0.5)
HCT: 43.7 % (ref 38.4–49.9)
HGB: 15.2 g/dL (ref 13.0–17.1)
LYMPH%: 21.1 % (ref 14.0–49.0)
MCH: 30.2 pg (ref 27.2–33.4)
MCHC: 34.8 g/dL (ref 32.0–36.0)
MCV: 86.9 fL (ref 79.3–98.0)
MONO#: 0.5 10*3/uL (ref 0.1–0.9)
MONO%: 12 % (ref 0.0–14.0)
NEUT#: 2.5 10*3/uL (ref 1.5–6.5)
NEUT%: 64.3 % (ref 39.0–75.0)
Platelets: 183 10*3/uL (ref 140–400)
RBC: 5.03 10*6/uL (ref 4.20–5.82)
RDW: 13.5 % (ref 11.0–14.6)
WBC: 3.8 10*3/uL — ABNORMAL LOW (ref 4.0–10.3)
lymph#: 0.8 10*3/uL — ABNORMAL LOW (ref 0.9–3.3)

## 2012-06-05 LAB — LACTATE DEHYDROGENASE (CC13): LDH: 184 U/L (ref 125–220)

## 2012-06-05 LAB — COMPREHENSIVE METABOLIC PANEL (CC13)
ALT: 16 U/L (ref 0–55)
AST: 18 U/L (ref 5–34)
Albumin: 4 g/dL (ref 3.5–5.0)
Alkaline Phosphatase: 88 U/L (ref 40–150)
BUN: 15 mg/dL (ref 7.0–26.0)
CO2: 29 mEq/L (ref 22–29)
Calcium: 9.3 mg/dL (ref 8.4–10.4)
Chloride: 104 mEq/L (ref 98–107)
Creatinine: 1.3 mg/dL (ref 0.7–1.3)
Glucose: 95 mg/dl (ref 70–99)
Potassium: 4.6 mEq/L (ref 3.5–5.1)
Sodium: 139 mEq/L (ref 136–145)
Total Bilirubin: 1.1 mg/dL (ref 0.20–1.20)
Total Protein: 6.5 g/dL (ref 6.4–8.3)

## 2012-06-05 NOTE — Progress Notes (Signed)
Patient ID: Ethan Dyer, male   DOB: 1951-06-02, 61 y.o.   MRN: [id number] CSN: [id number]   Cc:  Ines Bloomer, MD Angelia Mould. Derrell Lolling, MD Shirley Friar, MD Rachael Fee, MD Elana Alm. Nicholos Johns, MD  Problem List: Ethan Dyer is a 61 y.o. African-American male with a problem list consisting of:  1.  Poorly differentiated adenocarcinoma originating in the GE junction (July 2003) T3N1 2.  Ivor-Lewis esophagogastrectomy ( April 20, 2002) 3.  Perigastric LN (+) 4. 5FU/LV/RT        (06/10/2002 - 10/09/2002) 5. Radiation 5040  (06/29/2002 - 08/05/2002) 6.  Nephrolithiasis 7.  L5 sclerotic lesion (2007) 8.  HTN 9.  Elevated glucose 10.Hyperlipidemia 11.Degenerative spine disease 12.Colonic polyps - tubular adenomas (06/13/2005) 13.Family history of prostate CA  Ethan Dyer is a pleasant 61 year-old African-American male who was seen in our office today for follow-up of his poorly differentiated adenocarcinoma of originating in the GE junction.  His diagnosis dates back to over 10 years ago (mid July 2003). The cancer was staged T3 N1. The patient underwent an Ivor-Lewis esophagogastrectomy on 04/20/2002 followed by a combination of chemotherapy and radiation treatments. His treatments were completed in January 2004. Mr. Ethan Dyer is now 10 years from the time of diagnosis and remains disease-free. He was last seen by [place] on 06/08/2011. He continues to do well without any major complaint. For the last several years (12 years) the patient has complained of dysphagia related to consuming solid foods.  He states if he swallows certain types of meat, the passage of this type/texture of food causes him discomfort. The patient also states that he's had a decrease in energy. The patient has also recently experienced intermittent shortness of breath and lightheadedness.  The patient states that his lightheadedness has even at times caused him not to drive his automobile. The patient states that he is a  former Agricultural consultant at [employer], but he has not had the energy to volunteer lately.  Aside from the aforementioned,  the patient feels well.  Ethan Dyer mentioned concern about his weight loss throughout this time, but his weight has remained stable since his last office visit.  The patient declined a consult with a Dietician.   The patient states that he recently had an upper endoscopy with Dr. Bosie Clos and his last colonoscopy was approximately 1.5 years ago. Mr. Ethan Dyer states that he has never received the influenza vaccine.  The patient denies any other symptomatology or concerns at this time.   Past Medical History: Past Medical History  Diagnosis Date  . Gastroesophageal cancer   . Nephrolithiasis   . Adenoma of large intestine   . HTN (hypertension)   . Hyperlipidemia   . Degenerative joint disease of spine     Surgical History: Past Surgical History  Procedure Date  . Esophagogastrectomy 04/20/2002    Ivor-Lewis esophagogastrectomy    Current Medications: Current Outpatient Prescriptions  Medication Sig Dispense Refill  . esomeprazole (NEXIUM) 40 MG capsule Take 40 mg by mouth daily before breakfast.      . fexofenadine (ALLEGRA) 180 MG tablet Take 180 mg by mouth as needed.        Allergies: No Known Allergies   Family History: Family History  Problem Relation Age of Onset  . Hypertension Father     Social History: History  Substance Use Topics  . Smoking status: Former Smoker -- 0.3 packs/day for 12 years    Types: Cigarettes  . Smokeless  tobacco: Never Used  . Alcohol Use: 1.5 oz/week    3 drink(s) per week    Review of Systems: 10 Point review of systems was completed and is negative except as noted above.   Physical Exam:   Blood pressure 147/94, pulse 71, temperature 98.9 F (37.2 C), temperature source Oral, resp. rate 18, height 5\' 11"  (1.803 m), weight 167 lb 9.6 oz (76.023 kg).  General appearance: Alert, cooperative, and no  distress Head: Normocephalic, without obvious abnormality, atraumatic Eyes: Conjunctivae/corneas are clear,  PERRLA, EOMI, no icterus Nose: Nares, septum and mucosa are normal, no drainage or sinus tenderness. Neck: No adenopathy, supple, symmetrical, trachea midline, no tenderness Resp: Clear to auscultation bilaterally Cardio: Regular rate and rhythm, S1, S2 normal, no murmur, click, rub or gallop GI: Soft, non-tender, hypoactive bowel sounds, no organomegaly, well-healed surgical scar Extremities: Extremities normal, atraumatic, no cyanosis or edema Lymph nodes: Cervical, supraclavicular, and axillary nodes normal Neurologic: Grossly normal   Laboratory Data: Results for orders placed in visit on 06/05/12 (from the past 48 hour(s))  COMPREHENSIVE METABOLIC PANEL (CC13)     Status: Normal   Collection Time   06/05/12 10:02 AM      Component Value Range Comment   Sodium 139  136 - 145 mEq/L    Potassium 4.6  3.5 - 5.1 mEq/L    Chloride 104  98 - 107 mEq/L    CO2 29  22 - 29 mEq/L    Glucose 95  70 - 99 mg/dl    BUN 82.9  7.0 - 56.2 mg/dL    Creatinine 1.3  0.7 - 1.3 mg/dL    Total Bilirubin 1.30  0.20 - 1.20 mg/dL    Alkaline Phosphatase 88  40 - 150 U/L    AST 18  5 - 34 U/L    Total Protein 6.5  6.4 - 8.3 g/dL    ALT 16  0 - 55 U/L    Albumin 4.0  3.5 - 5.0 g/dL    Calcium 9.3  8.4 - 86.5 mg/dL   LACTATE DEHYDROGENASE (CC13)     Status: Normal   Collection Time   06/05/12 10:02 AM      Component Value Range Comment   LDH 184  125 - 220 U/L   CBC WITH DIFFERENTIAL     Status: Abnormal   Collection Time   06/05/12 10:02 AM      Component Value Range Comment   WBC 3.8 (*) 4.0 - 10.3 10e3/uL    NEUT# 2.5  1.5 - 6.5 10e3/uL    HGB 15.2  13.0 - 17.1 g/dL    HCT 78.4  69.6 - 29.5 %    Platelets 183  140 - 400 10e3/uL    MCV 86.9  79.3 - 98.0 fL    MCH 30.2  27.2 - 33.4 pg    MCHC 34.8  32.0 - 36.0 g/dL    RBC 2.84  1.32 - 4.40 10e6/uL    RDW 13.5  11.0 - 14.6 %    lymph#  0.8 (*) 0.9 - 3.3 10e3/uL    MONO# 0.5  0.1 - 0.9 10e3/uL    Eosinophils Absolute 0.0  0.0 - 0.5 10e3/uL    Basophils Absolute 0.1  0.0 - 0.1 10e3/uL    NEUT% 64.3  39.0 - 75.0 %    LYMPH% 21.1  14.0 - 49.0 %    MONO% 12.0  0.0 - 14.0 %    EOS% 1.0  0.0 - 7.0 %  BASO% 1.6  0.0 - 2.0 %      Imaging Studies: 05/18/2010 Chest 2 Views - IMPRESSION: No active disease. No significant change. Stable left basilar scarring.   Impression/Plan: Ethan Dyer continues to do well with no evidence of disease now over 10 years from the time of diagnosis.  The patient was asked to follow up with Dr. Bosie Clos regarding his dysphagia.  The patient was also asked to follow up with Dr. Nicholos Johns regarding his lightheadedness.  We will plan to see Ethan Dyer again in one years' time, at which time we will check CMP, CBC and LDH.  A follow-up chest x-ray has been ordered for today.  The patient was advised that if he has any questions in the interim, to please contact our office.  Dr. Arline Asp saw the patient during today's office visit and has reviewed the plan of care.    Orean Giarratano NP-C 06/05/2012, 10:26 AM

## 2012-06-05 NOTE — Progress Notes (Signed)
Quick Note:  Please notify patient and call/fax these results to patient's doctors. ______ 

## 2012-06-05 NOTE — Telephone Encounter (Signed)
gve the pt his aug 2014 appt calendar. °

## 2012-06-05 NOTE — Patient Instructions (Addendum)
Patient ID: Ethan Dyer,   DOB: 1951/10/02,  MRN: [id number]   [facility] Discharge Instructions  RECOMMENDATIONS MAD BY THE CONSULTANT AND ANY TEST RESULT(S) WILL BE FORWARDED TO YOU REFERRING DOCTOR   EXAM FINDINGS BY NURSE PRACTITIONER TODAY TO REPORT TO THE CLINIC OR PRIMARY PROVIDER: N/A   Your Current Medications Are: Current Outpatient Prescriptions  Medication Sig Dispense Refill  . esomeprazole (NEXIUM) 40 MG capsule Take 40 mg by mouth daily before breakfast.      . fexofenadine (ALLEGRA) 180 MG tablet Take 180 mg by mouth as needed.         INSTRUCTIONS GIVEN, DISCUSSED AND FOLLOW-UP: Follow up on dizziness and low energy with primary care physician Dr. Bosie Clos - follow up on swallowing difficulties   I acknowledge that I have been informed and understand all the instructions given to me and have received a copy.  I do not have any further questions at this time, but I understand that I may call the [facility] at [phone] during business hours should I have any further questions or need assistance in obtaining follow-up care.   06/05/2012, 11:15 AM

## 2012-06-24 DIAGNOSIS — H2510 Age-related nuclear cataract, unspecified eye: Secondary | ICD-10-CM | POA: Diagnosis not present

## 2013-01-26 DIAGNOSIS — R1013 Epigastric pain: Secondary | ICD-10-CM | POA: Diagnosis not present

## 2013-01-26 DIAGNOSIS — C169 Malignant neoplasm of stomach, unspecified: Secondary | ICD-10-CM | POA: Diagnosis not present

## 2013-01-26 DIAGNOSIS — K219 Gastro-esophageal reflux disease without esophagitis: Secondary | ICD-10-CM | POA: Diagnosis not present

## 2013-02-20 DIAGNOSIS — R5381 Other malaise: Secondary | ICD-10-CM | POA: Diagnosis not present

## 2013-02-20 DIAGNOSIS — Z125 Encounter for screening for malignant neoplasm of prostate: Secondary | ICD-10-CM | POA: Diagnosis not present

## 2013-02-20 DIAGNOSIS — Z136 Encounter for screening for cardiovascular disorders: Secondary | ICD-10-CM | POA: Diagnosis not present

## 2013-02-20 DIAGNOSIS — R079 Chest pain, unspecified: Secondary | ICD-10-CM | POA: Diagnosis not present

## 2013-02-20 DIAGNOSIS — Z0000 Encounter for general adult medical examination without abnormal findings: Secondary | ICD-10-CM | POA: Diagnosis not present

## 2013-02-20 DIAGNOSIS — Z23 Encounter for immunization: Secondary | ICD-10-CM | POA: Diagnosis not present

## 2013-02-20 DIAGNOSIS — R5383 Other fatigue: Secondary | ICD-10-CM | POA: Diagnosis not present

## 2013-02-24 ENCOUNTER — Other Ambulatory Visit: Payer: Self-pay | Admitting: Gastroenterology

## 2013-02-24 DIAGNOSIS — K229 Disease of esophagus, unspecified: Secondary | ICD-10-CM | POA: Diagnosis not present

## 2013-02-24 DIAGNOSIS — K209 Esophagitis, unspecified without bleeding: Secondary | ICD-10-CM | POA: Diagnosis not present

## 2013-02-24 DIAGNOSIS — K228 Other specified diseases of esophagus: Secondary | ICD-10-CM | POA: Diagnosis not present

## 2013-02-24 DIAGNOSIS — K319 Disease of stomach and duodenum, unspecified: Secondary | ICD-10-CM | POA: Diagnosis not present

## 2013-02-24 DIAGNOSIS — K2289 Other specified disease of esophagus: Secondary | ICD-10-CM | POA: Diagnosis not present

## 2013-02-24 DIAGNOSIS — R1013 Epigastric pain: Secondary | ICD-10-CM | POA: Diagnosis not present

## 2013-02-24 DIAGNOSIS — K219 Gastro-esophageal reflux disease without esophagitis: Secondary | ICD-10-CM | POA: Diagnosis not present

## 2013-02-24 DIAGNOSIS — Z85028 Personal history of other malignant neoplasm of stomach: Secondary | ICD-10-CM | POA: Diagnosis not present

## 2013-02-26 DIAGNOSIS — E538 Deficiency of other specified B group vitamins: Secondary | ICD-10-CM | POA: Diagnosis not present

## 2013-03-05 DIAGNOSIS — J069 Acute upper respiratory infection, unspecified: Secondary | ICD-10-CM | POA: Diagnosis not present

## 2013-03-05 DIAGNOSIS — E538 Deficiency of other specified B group vitamins: Secondary | ICD-10-CM | POA: Diagnosis not present

## 2013-03-10 DIAGNOSIS — I10 Essential (primary) hypertension: Secondary | ICD-10-CM | POA: Diagnosis not present

## 2013-03-10 DIAGNOSIS — I498 Other specified cardiac arrhythmias: Secondary | ICD-10-CM | POA: Diagnosis not present

## 2013-03-10 DIAGNOSIS — R079 Chest pain, unspecified: Secondary | ICD-10-CM | POA: Diagnosis not present

## 2013-03-10 DIAGNOSIS — C159 Malignant neoplasm of esophagus, unspecified: Secondary | ICD-10-CM | POA: Diagnosis not present

## 2013-03-12 DIAGNOSIS — E538 Deficiency of other specified B group vitamins: Secondary | ICD-10-CM | POA: Diagnosis not present

## 2013-03-13 ENCOUNTER — Telehealth: Payer: Self-pay

## 2013-03-13 DIAGNOSIS — R079 Chest pain, unspecified: Secondary | ICD-10-CM | POA: Diagnosis not present

## 2013-03-13 DIAGNOSIS — I498 Other specified cardiac arrhythmias: Secondary | ICD-10-CM | POA: Diagnosis not present

## 2013-03-13 DIAGNOSIS — C159 Malignant neoplasm of esophagus, unspecified: Secondary | ICD-10-CM | POA: Diagnosis not present

## 2013-03-13 DIAGNOSIS — I10 Essential (primary) hypertension: Secondary | ICD-10-CM | POA: Diagnosis not present

## 2013-03-13 NOTE — Telephone Encounter (Signed)
Pt received letter about Dr Arline Asp retiring and wondered if he could see him before he goes. Unable to fulfill that request. He asked if he could be transferred to [facility]. Information forwarded to [facility].

## 2013-03-16 ENCOUNTER — Ambulatory Visit: Payer: Self-pay | Admitting: Oncology

## 2013-03-16 ENCOUNTER — Telehealth: Payer: Self-pay | Admitting: Oncology

## 2013-03-16 NOTE — Telephone Encounter (Signed)
LVOM FOR PT IN RE TO NP APPT @ [facility] W/DR. FINNEGAN ON 07/15 @ 11 STATED IF THERE WERE ANY QUESTION TO PLS CALL. RECORDS HAVE BEEN FAXEDTO [phone]

## 2013-03-19 DIAGNOSIS — E538 Deficiency of other specified B group vitamins: Secondary | ICD-10-CM | POA: Diagnosis not present

## 2013-04-02 DIAGNOSIS — R072 Precordial pain: Secondary | ICD-10-CM | POA: Diagnosis not present

## 2013-04-02 DIAGNOSIS — E538 Deficiency of other specified B group vitamins: Secondary | ICD-10-CM | POA: Diagnosis not present

## 2013-04-08 ENCOUNTER — Other Ambulatory Visit: Payer: Self-pay | Admitting: Interventional Cardiology

## 2013-04-09 ENCOUNTER — Other Ambulatory Visit: Payer: Self-pay | Admitting: Interventional Cardiology

## 2013-04-09 DIAGNOSIS — R079 Chest pain, unspecified: Secondary | ICD-10-CM

## 2013-04-14 ENCOUNTER — Ambulatory Visit
Admission: RE | Admit: 2013-04-14 | Discharge: 2013-04-14 | Disposition: A | Discharge status: Home / Self Care | Payer: 59 | Source: Ambulatory Visit | Attending: Interventional Cardiology | Admitting: Interventional Cardiology

## 2013-04-14 DIAGNOSIS — R079 Chest pain, unspecified: Secondary | ICD-10-CM | POA: Diagnosis not present

## 2013-04-14 MED ORDER — IOHEXOL 350 MG/ML SOLN
100.0000 mL | Freq: Once | INTRAVENOUS | Status: AC | PRN
  Administered 2013-04-14: 100 mL via INTRAVENOUS

## 2013-04-16 DIAGNOSIS — E538 Deficiency of other specified B group vitamins: Secondary | ICD-10-CM | POA: Diagnosis not present

## 2013-04-23 ENCOUNTER — Ambulatory Visit: Payer: Self-pay | Admitting: Oncology

## 2013-04-23 DIAGNOSIS — Z9221 Personal history of antineoplastic chemotherapy: Secondary | ICD-10-CM | POA: Diagnosis not present

## 2013-04-23 DIAGNOSIS — Z923 Personal history of irradiation: Secondary | ICD-10-CM | POA: Diagnosis not present

## 2013-04-23 DIAGNOSIS — Z8501 Personal history of malignant neoplasm of esophagus: Secondary | ICD-10-CM | POA: Diagnosis not present

## 2013-04-27 DIAGNOSIS — E559 Vitamin D deficiency, unspecified: Secondary | ICD-10-CM | POA: Diagnosis not present

## 2013-04-27 DIAGNOSIS — K8020 Calculus of gallbladder without cholecystitis without obstruction: Secondary | ICD-10-CM | POA: Diagnosis not present

## 2013-04-27 DIAGNOSIS — F329 Major depressive disorder, single episode, unspecified: Secondary | ICD-10-CM | POA: Diagnosis not present

## 2013-04-27 DIAGNOSIS — E041 Nontoxic single thyroid nodule: Secondary | ICD-10-CM | POA: Diagnosis not present

## 2013-04-27 DIAGNOSIS — E538 Deficiency of other specified B group vitamins: Secondary | ICD-10-CM | POA: Diagnosis not present

## 2013-04-28 ENCOUNTER — Other Ambulatory Visit: Payer: Self-pay | Admitting: Physician Assistant

## 2013-04-28 DIAGNOSIS — E041 Nontoxic single thyroid nodule: Secondary | ICD-10-CM

## 2013-04-30 ENCOUNTER — Ambulatory Visit
Admission: RE | Admit: 2013-04-30 | Discharge: 2013-04-30 | Disposition: A | Discharge status: Home / Self Care | Payer: 59 | Source: Ambulatory Visit | Attending: Physician Assistant | Admitting: Physician Assistant

## 2013-04-30 DIAGNOSIS — E042 Nontoxic multinodular goiter: Secondary | ICD-10-CM | POA: Diagnosis not present

## 2013-04-30 DIAGNOSIS — E041 Nontoxic single thyroid nodule: Secondary | ICD-10-CM

## 2013-05-05 ENCOUNTER — Other Ambulatory Visit: Payer: Self-pay | Admitting: Internal Medicine

## 2013-05-05 DIAGNOSIS — E042 Nontoxic multinodular goiter: Secondary | ICD-10-CM | POA: Diagnosis not present

## 2013-05-05 DIAGNOSIS — E041 Nontoxic single thyroid nodule: Secondary | ICD-10-CM

## 2013-05-06 ENCOUNTER — Other Ambulatory Visit (HOSPITAL_COMMUNITY)
Admission: RE | Admit: 2013-05-06 | Discharge: 2013-05-06 | Disposition: A | Discharge status: Home / Self Care | Payer: Medicare Other | Source: Ambulatory Visit | Attending: Interventional Radiology | Admitting: Interventional Radiology

## 2013-05-06 ENCOUNTER — Ambulatory Visit
Admission: RE | Admit: 2013-05-06 | Discharge: 2013-05-06 | Disposition: A | Discharge status: Home / Self Care | Payer: Medicare Other | Source: Ambulatory Visit | Attending: Internal Medicine | Admitting: Internal Medicine

## 2013-05-06 DIAGNOSIS — E041 Nontoxic single thyroid nodule: Secondary | ICD-10-CM

## 2013-05-08 ENCOUNTER — Ambulatory Visit: Payer: Self-pay | Admitting: Oncology

## 2013-05-18 DIAGNOSIS — E538 Deficiency of other specified B group vitamins: Secondary | ICD-10-CM | POA: Diagnosis not present

## 2013-05-28 ENCOUNTER — Telehealth: Payer: Self-pay | Admitting: Oncology

## 2013-05-28 ENCOUNTER — Telehealth: Payer: Self-pay | Admitting: Hematology and Oncology

## 2013-05-28 NOTE — Telephone Encounter (Signed)
pt called to cx appt pt transferred care to [place]

## 2013-06-04 ENCOUNTER — Ambulatory Visit: Payer: Self-pay

## 2013-06-04 ENCOUNTER — Other Ambulatory Visit: Payer: Medicare Other | Admitting: Lab

## 2013-06-17 ENCOUNTER — Ambulatory Visit: Payer: Self-pay

## 2013-06-17 ENCOUNTER — Other Ambulatory Visit: Payer: Self-pay | Admitting: Lab

## 2013-11-03 ENCOUNTER — Other Ambulatory Visit: Payer: Self-pay | Admitting: Internal Medicine

## 2013-11-03 DIAGNOSIS — E042 Nontoxic multinodular goiter: Secondary | ICD-10-CM | POA: Diagnosis not present

## 2013-11-03 DIAGNOSIS — E049 Nontoxic goiter, unspecified: Secondary | ICD-10-CM

## 2013-11-09 ENCOUNTER — Ambulatory Visit
Admission: RE | Admit: 2013-11-09 | Discharge: 2013-11-09 | Disposition: A | Discharge status: Home / Self Care | Payer: 59 | Source: Ambulatory Visit | Attending: Internal Medicine | Admitting: Internal Medicine

## 2013-11-09 DIAGNOSIS — E042 Nontoxic multinodular goiter: Secondary | ICD-10-CM | POA: Diagnosis not present

## 2013-11-09 DIAGNOSIS — E049 Nontoxic goiter, unspecified: Secondary | ICD-10-CM

## 2013-12-15 DIAGNOSIS — H2510 Age-related nuclear cataract, unspecified eye: Secondary | ICD-10-CM | POA: Diagnosis not present

## 2013-12-29 DIAGNOSIS — M545 Low back pain, unspecified: Secondary | ICD-10-CM | POA: Diagnosis not present

## 2014-04-28 ENCOUNTER — Ambulatory Visit: Payer: Self-pay | Admitting: Oncology

## 2014-04-28 DIAGNOSIS — Z8042 Family history of malignant neoplasm of prostate: Secondary | ICD-10-CM | POA: Diagnosis not present

## 2014-04-28 DIAGNOSIS — R1319 Other dysphagia: Secondary | ICD-10-CM | POA: Diagnosis not present

## 2014-04-28 DIAGNOSIS — Z8501 Personal history of malignant neoplasm of esophagus: Secondary | ICD-10-CM | POA: Diagnosis not present

## 2014-04-28 DIAGNOSIS — Z923 Personal history of irradiation: Secondary | ICD-10-CM | POA: Diagnosis not present

## 2014-04-28 DIAGNOSIS — Z9221 Personal history of antineoplastic chemotherapy: Secondary | ICD-10-CM | POA: Diagnosis not present

## 2014-04-28 DIAGNOSIS — K219 Gastro-esophageal reflux disease without esophagitis: Secondary | ICD-10-CM | POA: Diagnosis not present

## 2014-04-28 DIAGNOSIS — Z79899 Other long term (current) drug therapy: Secondary | ICD-10-CM | POA: Diagnosis not present

## 2014-04-28 DIAGNOSIS — E785 Hyperlipidemia, unspecified: Secondary | ICD-10-CM | POA: Diagnosis not present

## 2014-04-28 LAB — COMPREHENSIVE METABOLIC PANEL
Albumin: 4 g/dL (ref 3.4–5.0)
Alkaline Phosphatase: 92 U/L
Anion Gap: 4 — ABNORMAL LOW (ref 7–16)
BUN: 14 mg/dL (ref 7–18)
Bilirubin,Total: 0.9 mg/dL (ref 0.2–1.0)
Calcium, Total: 9.4 mg/dL (ref 8.5–10.1)
Chloride: 102 mmol/L (ref 98–107)
Co2: 33 mmol/L — ABNORMAL HIGH (ref 21–32)
Creatinine: 1.38 mg/dL — ABNORMAL HIGH (ref 0.60–1.30)
EGFR (African American): >60
EGFR (Non-African Amer.): 55 — ABNORMAL LOW
Glucose: 67 mg/dL (ref 65–99)
Osmolality: 276 (ref 275–301)
Potassium: 4.1 mmol/L (ref 3.5–5.1)
SGOT(AST): 16 U/L (ref 15–37)
SGPT (ALT): 22 U/L
Sodium: 139 mmol/L (ref 136–145)
Total Protein: 7 g/dL (ref 6.4–8.2)

## 2014-04-28 LAB — CBC CANCER CENTER
Basophil #: 0.1 x10 3/mm (ref 0.0–0.1)
Basophil %: 2.3 %
Eosinophil #: 0.1 x10 3/mm (ref 0.0–0.7)
Eosinophil %: 1.9 %
HCT: 48.6 % (ref 40.0–52.0)
HGB: 16.1 g/dL (ref 13.0–18.0)
Lymphocyte #: 0.8 x10 3/mm — ABNORMAL LOW (ref 1.0–3.6)
Lymphocyte %: 17 %
MCH: 30.2 pg (ref 26.0–34.0)
MCHC: 33.1 g/dL (ref 32.0–36.0)
MCV: 91 fL (ref 80–100)
Monocyte #: 0.6 x10 3/mm (ref 0.2–1.0)
Monocyte %: 11.7 %
Neutrophil #: 3.2 x10 3/mm (ref 1.4–6.5)
Neutrophil %: 67.1 %
Platelet: 188 x10 3/mm (ref 150–440)
RBC: 5.32 10*6/uL (ref 4.40–5.90)
RDW: 13.8 % (ref 11.5–14.5)
WBC: 4.8 x10 3/mm (ref 3.8–10.6)

## 2014-04-28 LAB — LACTATE DEHYDROGENASE: LDH: 182 U/L (ref 85–241)

## 2014-05-08 ENCOUNTER — Ambulatory Visit: Payer: Self-pay | Admitting: Oncology

## 2014-12-09 ENCOUNTER — Encounter: Payer: Self-pay | Admitting: Family Medicine

## 2015-04-27 ENCOUNTER — Other Ambulatory Visit: Payer: Self-pay | Admitting: Gastroenterology

## 2015-04-27 DIAGNOSIS — K219 Gastro-esophageal reflux disease without esophagitis: Secondary | ICD-10-CM | POA: Diagnosis not present

## 2015-04-27 DIAGNOSIS — K640 First degree hemorrhoids: Secondary | ICD-10-CM | POA: Diagnosis not present

## 2015-04-27 DIAGNOSIS — Z85028 Personal history of other malignant neoplasm of stomach: Secondary | ICD-10-CM | POA: Diagnosis not present

## 2015-04-27 DIAGNOSIS — Z09 Encounter for follow-up examination after completed treatment for conditions other than malignant neoplasm: Secondary | ICD-10-CM | POA: Diagnosis not present

## 2015-04-27 DIAGNOSIS — D126 Benign neoplasm of colon, unspecified: Secondary | ICD-10-CM | POA: Diagnosis not present

## 2015-04-27 DIAGNOSIS — Z86010 Personal history of colonic polyps: Secondary | ICD-10-CM | POA: Diagnosis not present

## 2015-04-27 DIAGNOSIS — K209 Esophagitis, unspecified: Secondary | ICD-10-CM | POA: Diagnosis not present

## 2015-04-27 DIAGNOSIS — K2950 Unspecified chronic gastritis without bleeding: Secondary | ICD-10-CM | POA: Diagnosis not present

## 2015-04-27 DIAGNOSIS — R12 Heartburn: Secondary | ICD-10-CM | POA: Diagnosis not present

## 2015-04-27 DIAGNOSIS — D122 Benign neoplasm of ascending colon: Secondary | ICD-10-CM | POA: Diagnosis not present

## 2015-04-27 DIAGNOSIS — D125 Benign neoplasm of sigmoid colon: Secondary | ICD-10-CM | POA: Diagnosis not present

## 2016-07-05 DIAGNOSIS — H2513 Age-related nuclear cataract, bilateral: Secondary | ICD-10-CM | POA: Diagnosis not present

## 2017-08-21 DIAGNOSIS — K219 Gastro-esophageal reflux disease without esophagitis: Secondary | ICD-10-CM | POA: Diagnosis not present

## 2019-09-15 ENCOUNTER — Other Ambulatory Visit: Payer: Self-pay | Admitting: Family Medicine

## 2019-09-15 DIAGNOSIS — R1310 Dysphagia, unspecified: Secondary | ICD-10-CM

## 2019-09-23 ENCOUNTER — Other Ambulatory Visit: Payer: Self-pay | Admitting: Gastroenterology

## 2019-09-23 ENCOUNTER — Ambulatory Visit
Admission: RE | Admit: 2019-09-23 | Discharge: 2019-09-23 | Disposition: A | Discharge status: Home / Self Care | Payer: Medicare Other | Source: Ambulatory Visit | Attending: Family Medicine | Admitting: Family Medicine

## 2019-09-23 DIAGNOSIS — R1310 Dysphagia, unspecified: Secondary | ICD-10-CM

## 2020-02-11 ENCOUNTER — Encounter: Payer: Self-pay | Admitting: Urology

## 2020-02-11 ENCOUNTER — Encounter
Admission: RE | Disposition: A | Discharge status: Home / Self Care | Payer: Self-pay | Source: Home / Self Care | Attending: Urology

## 2020-02-11 ENCOUNTER — Other Ambulatory Visit: Payer: Self-pay

## 2020-02-11 ENCOUNTER — Ambulatory Visit
Admission: RE | Admit: 2020-02-11 | Discharge: 2020-02-11 | Disposition: A | Discharge status: Home / Self Care | Payer: Medicare Other | Attending: Urology | Admitting: Urology

## 2020-02-11 DIAGNOSIS — Z87891 Personal history of nicotine dependence: Secondary | ICD-10-CM | POA: Diagnosis not present

## 2020-02-11 DIAGNOSIS — N201 Calculus of ureter: Secondary | ICD-10-CM | POA: Insufficient documentation

## 2020-02-11 DIAGNOSIS — Z8501 Personal history of malignant neoplasm of esophagus: Secondary | ICD-10-CM | POA: Diagnosis not present

## 2020-02-11 DIAGNOSIS — N400 Enlarged prostate without lower urinary tract symptoms: Secondary | ICD-10-CM | POA: Diagnosis not present

## 2020-02-11 HISTORY — PX: EXTRACORPOREAL SHOCK WAVE LITHOTRIPSY: SHX1557

## 2020-02-11 SURGERY — LITHOTRIPSY, ESWL
Anesthesia: Moderate Sedation | Laterality: Right

## 2020-02-11 MED ORDER — DEXTROSE-NACL 5-0.45 % IV SOLN: INTRAVENOUS | Status: DC

## 2020-02-11 MED ORDER — CIPROFLOXACIN HCL 500 MG PO TABS
500.0000 mg | ORAL_TABLET | Freq: Two times a day (BID) | ORAL | 1 refills | Status: DC
Start: 2020-02-11 — End: 2020-12-01

## 2020-02-11 MED ORDER — MORPHINE SULFATE (PF) 10 MG/ML IV SOLN
INTRAVENOUS | Status: AC
  Filled 2020-02-11: qty 1

## 2020-02-11 MED ORDER — FUROSEMIDE 10 MG/ML IJ SOLN: 10.0000 mg | Freq: Once | INTRAMUSCULAR | Status: AC

## 2020-02-11 MED ORDER — NUCYNTA 50 MG PO TABS: 50.0000 mg | ORAL_TABLET | Freq: Four times a day (QID) | ORAL | 0 refills | Status: DC | PRN

## 2020-02-11 MED ORDER — MIDAZOLAM HCL 2 MG/2ML IJ SOLN
1.0000 mg | Freq: Once | INTRAMUSCULAR | Status: AC
  Administered 2020-02-11: 1 mg via INTRAMUSCULAR

## 2020-02-11 MED ORDER — MORPHINE SULFATE (PF) 10 MG/ML IV SOLN
10.0000 mg | Freq: Once | INTRAVENOUS | Status: AC
  Administered 2020-02-11: 10 mg via INTRAMUSCULAR

## 2020-02-11 MED ORDER — DIPHENHYDRAMINE HCL 25 MG PO CAPS
ORAL_CAPSULE | ORAL | Status: AC
  Filled 2020-02-11: qty 1

## 2020-02-11 MED ORDER — ONDANSETRON 8 MG PO TBDP: 4.0000 mg | ORAL_TABLET | Freq: Four times a day (QID) | ORAL | 3 refills | Status: DC | PRN

## 2020-02-11 MED ORDER — FUROSEMIDE 10 MG/ML IJ SOLN
INTRAMUSCULAR | Status: AC
  Administered 2020-02-11: 10 mg via INTRAVENOUS
  Filled 2020-02-11: qty 2

## 2020-02-11 MED ORDER — LEVOFLOXACIN 500 MG PO TABS
ORAL_TABLET | ORAL | Status: AC
  Filled 2020-02-11: qty 1

## 2020-02-11 MED ORDER — PROMETHAZINE HCL 25 MG/ML IJ SOLN
25.0000 mg | Freq: Once | INTRAMUSCULAR | Status: AC
  Administered 2020-02-11: 25 mg via INTRAMUSCULAR

## 2020-02-11 MED ORDER — PROMETHAZINE HCL 25 MG/ML IJ SOLN
INTRAMUSCULAR | Status: AC
  Filled 2020-02-11: qty 1

## 2020-02-11 MED ORDER — LEVOFLOXACIN 500 MG PO TABS
500.0000 mg | ORAL_TABLET | ORAL | Status: AC
  Administered 2020-02-11: 500 mg via ORAL

## 2020-02-11 MED ORDER — TAMSULOSIN HCL 0.4 MG PO CAPS: 0.4000 mg | ORAL_CAPSULE | Freq: Every day | ORAL | 11 refills | Status: DC

## 2020-02-11 MED ORDER — DIPHENHYDRAMINE HCL 25 MG PO CAPS
25.0000 mg | ORAL_CAPSULE | ORAL | Status: AC
  Administered 2020-02-11: 25 mg via ORAL

## 2020-02-11 MED ORDER — MIDAZOLAM HCL 2 MG/2ML IJ SOLN
INTRAMUSCULAR | Status: AC
  Filled 2020-02-11: qty 2

## 2020-02-11 NOTE — Discharge Instructions (Addendum)
Renal Colic  Renal colic is pain that is caused by a kidney stone. The pain can be sharp and very bad. It may be felt in the back, belly, side (flank), or groin. It can cause nausea. Renal colic can come and go. Follow these instructions at home: Medicines  Take over-the-counter and prescription medicines only as told by your doctor.  Do not drive or use heavy machinery while taking prescription pain medicine. Eating and drinking   Drink enough fluid to keep your pee (urine) pale yellow. You may be told to drink at least 8-10 glasses of water each day. Follow instructions from your doctor.  If told, change your diet. This may include eating: ? Less salt (sodium). Eat less than 2 grams (2,000 mg) of salt per day. ? Less meat, poultry, fish, and eggs. ? More fruits and vegetables. ? Try not to eat spinach, rhubarb, sweet potatoes, or nuts.  Follow instructions from your doctor about what foods and drinks to avoid. General instructions  Keep all follow-up visits as told by your doctor. This is important.  Collect pee samples as told by your doctor.  Strain your pee every time you pee, as told by your doctor. Use the strainer that your doctor recommends.  Do not throw out the kidney stone after passing it. Keep the stone so it can be tested by your doctor. Contact a doctor if:  You have a fever or chills.  Your pee smells bad or looks cloudy.  You have pain or burning when you pee. Get help right away if:  The pain in your side (flank) or your groin suddenly gets worse.  You get confused.  You pass out. Summary  Renal colic is pain that is caused by a kidney stone.  Take over-the-counter and prescription medicines only as told by your doctor.  Drink enough fluid to keep your pee pale yellow. You may be told to drink at least 8-10 glasses of water each day. Follow instructions from your doctor.  Strain your pee every time you pee, as told by your doctor. Use the  strainer that your doctor recommends.  Do not throw out the kidney stone after passing it. Keep the stone so it can be tested by your doctor. This information is not intended to replace advice given to you by your health care provider. Make sure you discuss any questions you have with your health care provider. Document Revised: 10/22/2017 Document Reviewed: 10/22/2017 Elsevier Patient Education  2020 [employer]. Kidney Stones Kidney stones are rock-like masses that form inside of the kidneys. Kidneys are organs that make pee (urine). A kidney stone may move into other parts of the urinary tract, including:  The tubes that connect the kidneys to the bladder (ureters).  The bladder.  The tube that carries urine out of the body (urethra). Kidney stones can cause very bad pain and can block the flow of pee. The stone usually leaves your body (passes) through your pee. You may need to have a doctor take out the stone. What are the causes? Kidney stones may be caused by:  A condition in which certain glands make too much parathyroid hormone (primary hyperparathyroidism).  A buildup of a type of crystals in the bladder made of a chemical called uric acid. The body makes uric acid when you eat certain foods.  Narrowing (stricture) of one or both of the ureters.  A kidney blockage that you were born with.  Past surgery on the kidney or  the ureters, such as gastric bypass surgery. What increases the risk? You are more likely to develop this condition if:  You have had a kidney stone in the past.  You have a family history of kidney stones.  You do not drink enough water.  You eat a diet that is high in protein, salt (sodium), or sugar.  You are overweight or very overweight (obese). What are the signs or symptoms? Symptoms of a kidney stone may include:  Pain in the side of the belly, right below the ribs (flank pain). Pain usually spreads (radiates) to the groin.  Needing to  pee often or right away (urgently).  Pain when going pee (urinating).  Blood in your pee (hematuria).  Feeling like you may vomit (nauseous).  Vomiting.  Fever and chills. How is this treated? Treatment depends on the size, location, and makeup of the kidney stones. The stones will often pass out of the body through peeing. You may need to:  Drink more fluid to help pass the stone. In some cases, you may be given fluids through an IV tube put into one of your veins at the hospital.  Take medicine for pain.  Make changes in your diet to help keep kidney stones from coming back. Sometimes, medical procedures are needed to remove a kidney stone. This may involve:  A procedure to break up kidney stones using a beam of light (laser) or shock waves.  Surgery to remove the kidney stones. Follow these instructions at home: Medicines  Take over-the-counter and prescription medicines only as told by your doctor.  Ask your doctor if the medicine prescribed to you requires you to avoid driving or using heavy machinery. Eating and drinking  Drink enough fluid to keep your pee pale yellow. You may be told to drink at least 8-10 glasses of water each day. This will help you pass the stone.  If told by your doctor, change your diet. This may include: ? Limiting how much salt you eat. ? Eating more fruits and vegetables. ? Limiting how much meat, poultry, fish, and eggs you eat.  Follow instructions from your doctor about eating or drinking restrictions. General instructions  Collect pee samples as told by your doctor. You may need to collect a pee sample: ? 24 hours after a stone comes out. ? 8-12 weeks after a stone comes out, and every 6-12 months after that.  Strain your pee every time you pee (urinate), for as long as told. Use the strainer that your doctor recommends.  Do not throw out the stone. Keep it so that it can be tested by your doctor.  Keep all follow-up visits as  told by your doctor. This is important. You may need follow-up tests. How is this prevented? To prevent another kidney stone:  Drink enough fluid to keep your pee pale yellow. This is the best way to prevent kidney stones.  Eat healthy foods.  Avoid certain foods as told by your doctor. You may be told to eat less protein.  Stay at a healthy weight. Where to find more information  [place] ([facility]): www.kidney.[facility]): www.urologyhealth.org Contact a doctor if:  You have pain that gets worse or does not get better with medicine. Get help right away if:  You have a fever or chills.  You get very bad pain.  You get new pain in your belly (abdomen).  You pass out (faint).  You cannot pee. Summary  Kidney  stones are rock-like masses that form inside of the kidneys.  Kidney stones can cause very bad pain and can block the flow of pee.  The stones will often pass out of the body through peeing.  Drink enough fluid to keep your pee pale yellow. This information is not intended to replace advice given to you by your health care provider. Make sure you discuss any questions you have with your health care provider. Document Revised: 02/10/2019 Document Reviewed: 02/10/2019 Elsevier Patient Education  [place] After This sheet gives you information about how to care for yourself after your procedure. Your health care provider may also give you more specific instructions. If you have problems or questions, contact your health care provider. What can I expect after the procedure? After the procedure, it is common to have:  Some blood in your urine. This should only last for a few days.  Soreness in your back, sides, or upper abdomen for a few days.  Blotches or bruises on your back where the pressure wave entered the skin.  Pain, discomfort, or nausea when pieces (fragments) of the kidney stone move through  the tube that carries urine from the kidney to the bladder (ureter). Stone fragments may pass soon after the procedure, but they may continue to pass for up to 4-8 weeks. ? If you have severe pain or nausea, contact your health care provider. This may be caused by a large stone that was not broken up, and this may mean that you need more treatment.  Some pain or discomfort during urination.  Some pain or discomfort in the lower abdomen or (in men) at the base of the penis. Follow these instructions at home: Medicines  Take over-the-counter and prescription medicines only as told by your health care provider.  If you were prescribed an antibiotic medicine, take it as told by your health care provider. Do not stop taking the antibiotic even if you start to feel better.  Do not drive for 24 hours if you were given a medicine to help you relax (sedative).  Do not drive or use heavy machinery while taking prescription pain medicine. Eating and drinking      Drink enough water and fluids to keep your urine clear or pale yellow. This helps any remaining pieces of the stone to pass. It can also help prevent new stones from forming.  Eat plenty of fresh fruits and vegetables.  Follow instructions from your health care provider about eating and drinking restrictions. You may be instructed: ? To reduce how much salt (sodium) you eat or drink. Check ingredients and nutrition facts on packaged foods and beverages. ? To reduce how much meat you eat.  Eat the recommended amount of calcium for your age and gender. Ask your health care provider how much calcium you should have. General instructions  Get plenty of rest.  Most people can resume normal activities 1-2 days after the procedure. Ask your health care provider what activities are safe for you.  Your health care provider may direct you to lie in a certain position (postural drainage) and tap firmly (percuss) over your kidney area to help  stone fragments pass. Follow instructions as told by your health care provider.  If directed, strain all urine through the strainer that was provided by your health care provider. ? Keep all fragments for your health care provider to see. Any stones that are found may be sent to a medical lab for examination. The  stone may be as small as a grain of salt.  Keep all follow-up visits as told by your health care provider. This is important. Contact a health care provider if:  You have pain that is severe or does not get better with medicine.  You have nausea that is severe or does not go away.  You have blood in your urine longer than your health care provider told you to expect.  You have more blood in your urine.  You have pain during urination that does not go away.  You urinate more frequently than usual and this does not go away.  You develop a rash or any other possible signs of an allergic reaction. Get help right away if:  You have severe pain in your back, sides, or upper abdomen.  You have severe pain while urinating.  Your urine is very dark red.  You have blood in your stool (feces).  You cannot pass any urine at all.  You feel a strong urge to urinate after emptying your bladder.  You have a fever or chills.  You develop shortness of breath, difficulty breathing, or chest pain.  You have severe nausea that leads to persistent vomiting.  You faint. Summary  After this procedure, it is common to have some pain, discomfort, or nausea when pieces (fragments) of the kidney stone move through the tube that carries urine from the kidney to the bladder (ureter). If this pain or nausea is severe, however, you should contact your health care provider.  Most people can resume normal activities 1-2 days after the procedure. Ask your health care provider what activities are safe for you.  Drink enough water and fluids to keep your urine clear or pale yellow. This helps any  remaining pieces of the stone to pass, and it can help prevent new stones from forming.  If directed, strain your urine and keep all fragments for your health care provider to see. Fragments or stones may be as small as a grain of salt.  Get help right away if you have severe pain in your back, sides, or upper abdomen or have severe pain while urinating. This information is not intended to replace advice given to you by your health care provider. Make sure you discuss any questions you have with your health care provider. Document Revised: 01/05/2019 Document Reviewed: 08/15/2016 Elsevier Patient Education  2020 [place]   1) The drugs that you were given will stay in your system until tomorrow so for the next 24 hours you should not:  A) Drive an automobile B) Make any legal decisions C) Drink any alcoholic beverage   2) You may resume regular meals tomorrow.  Today it is better to start with liquids and gradually work up to solid foods.  You may eat anything you prefer, but it is better to start with liquids, then soup and crackers, and gradually work up to solid foods.   3) Please notify your doctor immediately if you have any unusual bleeding, trouble breathing, redness and pain at the surgery site, drainage, fever, or pain not relieved by medication.    4) Additional Instructions:        Please contact your physician with any problems or Same Day Surgery at [phone], Monday through Friday 6 am to 4 pm, or [place] at [facility] number at [phone].

## 2020-12-01 ENCOUNTER — Emergency Department: Payer: Medicare Other

## 2020-12-01 ENCOUNTER — Emergency Department
Admission: EM | Admit: 2020-12-01 | Discharge: 2020-12-01 | Disposition: A | Discharge status: Home / Self Care | Payer: Medicare Other | Attending: Emergency Medicine | Admitting: Emergency Medicine

## 2020-12-01 ENCOUNTER — Other Ambulatory Visit: Payer: Self-pay

## 2020-12-01 DIAGNOSIS — Z8501 Personal history of malignant neoplasm of esophagus: Secondary | ICD-10-CM | POA: Diagnosis not present

## 2020-12-01 DIAGNOSIS — I729 Aneurysm of unspecified site: Secondary | ICD-10-CM

## 2020-12-01 DIAGNOSIS — I6782 Cerebral ischemia: Secondary | ICD-10-CM | POA: Insufficient documentation

## 2020-12-01 DIAGNOSIS — R519 Headache, unspecified: Secondary | ICD-10-CM | POA: Diagnosis present

## 2020-12-01 DIAGNOSIS — I10 Essential (primary) hypertension: Secondary | ICD-10-CM | POA: Diagnosis not present

## 2020-12-01 DIAGNOSIS — Z87891 Personal history of nicotine dependence: Secondary | ICD-10-CM | POA: Insufficient documentation

## 2020-12-01 HISTORY — DX: Aneurysm of unspecified site: I72.9

## 2020-12-01 HISTORY — DX: Gastro-esophageal reflux disease without esophagitis: K21.9

## 2020-12-01 LAB — COMPREHENSIVE METABOLIC PANEL
ALT: 18 U/L (ref 0–44)
AST: 29 U/L (ref 15–41)
Albumin: 4.2 g/dL (ref 3.5–5.0)
Alkaline Phosphatase: 92 U/L (ref 38–126)
Anion gap: 8 (ref 5–15)
BUN: 11 mg/dL (ref 8–23)
CO2: 25 mmol/L (ref 22–32)
Calcium: 9.2 mg/dL (ref 8.9–10.3)
Chloride: 102 mmol/L (ref 98–111)
Creatinine, Ser: 1.24 mg/dL (ref 0.61–1.24)
GFR, Estimated: >60 mL/min (ref >60)
Glucose, Bld: 102 mg/dL — ABNORMAL HIGH (ref 70–99)
Potassium: 4.3 mmol/L (ref 3.5–5.1)
Sodium: 135 mmol/L (ref 135–145)
Total Bilirubin: 1.5 mg/dL — ABNORMAL HIGH (ref 0.3–1.2)
Total Protein: 6.9 g/dL (ref 6.5–8.1)

## 2020-12-01 LAB — DIFFERENTIAL
Abs Immature Granulocytes: 0.02 10*3/uL (ref 0.00–0.07)
Basophils Absolute: 0.1 10*3/uL (ref 0.0–0.1)
Basophils Relative: 1 %
Eosinophils Absolute: 0 10*3/uL (ref 0.0–0.5)
Eosinophils Relative: 0 %
Immature Granulocytes: 0 %
Lymphocytes Relative: 8 %
Lymphs Abs: 0.6 10*3/uL — ABNORMAL LOW (ref 0.7–4.0)
Monocytes Absolute: 0.4 10*3/uL (ref 0.1–1.0)
Monocytes Relative: 6 %
Neutro Abs: 6.1 10*3/uL (ref 1.7–7.7)
Neutrophils Relative %: 85 %

## 2020-12-01 LAB — CBC
HCT: 43.1 % (ref 39.0–52.0)
Hemoglobin: 14.7 g/dL (ref 13.0–17.0)
MCH: 30.8 pg (ref 26.0–34.0)
MCHC: 34.1 g/dL (ref 30.0–36.0)
MCV: 90.4 fL (ref 80.0–100.0)
Platelets: 196 10*3/uL (ref 150–400)
RBC: 4.77 MIL/uL (ref 4.22–5.81)
RDW: 12.7 % (ref 11.5–15.5)
WBC: 7.2 10*3/uL (ref 4.0–10.5)
nRBC: 0 % (ref 0.0–0.2)

## 2020-12-01 LAB — APTT: aPTT: 26 seconds (ref 24–36)

## 2020-12-01 LAB — PROTIME-INR
INR: 1 (ref 0.8–1.2)
Prothrombin Time: 12.7 seconds (ref 11.4–15.2)

## 2020-12-01 MED ORDER — IOHEXOL 350 MG/ML SOLN
75.0000 mL | Freq: Once | INTRAVENOUS | Status: AC | PRN
  Administered 2020-12-01: 75 mL via INTRAVENOUS

## 2020-12-01 NOTE — ED Provider Notes (Signed)
[facility] Emergency Department Provider Note   ____________________________________________   I have reviewed the triage vital signs and the nursing notes.   HISTORY  Chief Complaint Stroke Symptoms   History limited by: Not Limited   HPI Ethan Dyer is a 70 y.o. male who presents to the emergency department today because of concern for severe headache. 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 Located throughout his head he describes it as sharp. When he called his wife he recalls having some memory issues. He says he went home and took some tylenol and a nap and currently feels better. Only has a small sensation of pressure behind his eyes. The patient also has a history of some discomfort to the left upper thigh. Did feel like his legs were cramping when he had the headache.  Denies any trauma.  Records reviewed. Per medical record review patient has a history of HTN, HLD.   Past Medical History:  Diagnosis Date  . Adenoma of large intestine   . Degenerative joint disease of spine   . 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 GERD (gastroesophageal reflux disease)   . HTN (hypertension)   . Hyperlipidemia   . Nephrolithiasis     Patient Active Problem List   Diagnosis Date Noted  . 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 ESOPHAGOGASTRECTOMY  04/20/2002   Ivor-Lewis esophagogastrectomy  . EXTRACORPOREAL SHOCK WAVE LITHOTRIPSY Right 02/11/2020   Procedure: EXTRACORPOREAL SHOCK WAVE LITHOTRIPSY (ESWL);  Surgeon: Royston Cowper, MD;  Location: [facility] ORS;  Service: Urology;  Laterality: Right;  . KNEE SURGERY Left     Prior to Admission medications   Medication Sig Start Date End Date Taking? Authorizing Provider  ciprofloxacin (CIPRO) 500 MG tablet Take 1 tablet (500 mg total) by mouth 2 (two) times  daily. 02/11/20   Royston Cowper, MD  esomeprazole (NEXIUM) 40 MG capsule Take 40 mg by mouth daily before breakfast.    [provider]  fexofenadine (ALLEGRA) 180 MG tablet Take 180 mg by mouth as needed.    [provider]  NUCYNTA 50 MG tablet Take 1 tablet (50 mg total) by mouth every 6 (six) hours as needed for moderate pain. 1 TO 2 TABS Q 6 HOURS PRN PAIN 02/11/20   Royston Cowper, MD  ondansetron (ZOFRAN ODT) 8 MG disintegrating tablet Take 0.5 tablets (4 mg total) by mouth every 6 (six) hours as needed for nausea or vomiting. 02/11/20   Royston Cowper, MD  tamsulosin (FLOMAX) 0.4 MG CAPS capsule Take 1 capsule (0.4 mg total) by mouth daily. 02/11/20   Royston Cowper, MD    Allergies Patient has no known allergies.  Family History  Problem Relation Age of Onset  . Hypertension Father     Social History Social History   Tobacco Use  . Smoking status: Former Smoker    Packs/day: 0.30    Years: 12.00    Pack years: 3.60    Types: Cigarettes  . Smokeless tobacco: Never Used  Substance Use Topics  . Alcohol use: Yes    Alcohol/week: 1.0 standard drink    Types: 1 Standard drinks or equivalent per week  . Drug use: No    Review of Systems Constitutional: No fever/chills Eyes: No visual changes. ENT: No sore throat. Cardiovascular: Denies chest pain. Respiratory: Denies shortness  of breath. Gastrointestinal: No abdominal pain.  No nausea, no vomiting.  No diarrhea.   Genitourinary: Negative for dysuria. Musculoskeletal: Positive for leg cramping.  Skin: Negative for rash. Neurological: Positive for headache.  ____________________________________________   PHYSICAL EXAM:  VITAL SIGNS: ED Triage Vitals  Enc Vitals Group     BP 12/01/20 1347 (!) 157/93     Pulse Rate 12/01/20 1347 66     Resp 12/01/20 1347 16     Temp 12/01/20 1347 98 F (36.7 C)     Temp Source 12/01/20 1347 Oral     SpO2 12/01/20 1347 99 %     Weight 12/01/20 1349 168 lb  (76.2 kg)     Height 12/01/20 1349 6' (1.829 m)     Head Circumference --      Peak Flow --      Pain Score 12/01/20 1404 2   Constitutional: Alert and oriented.  Eyes: Conjunctivae are normal.  ENT      Head: Normocephalic and atraumatic.      Nose: No congestion/rhinnorhea.      Mouth/Throat: Mucous membranes are moist.      Neck: No stridor. Hematological/Lymphatic/Immunilogical: No cervical lymphadenopathy. Cardiovascular: Normal rate, regular rhythm.  No murmurs, rubs, or gallops.  Respiratory: Normal respiratory effort without tachypnea nor retractions. Breath sounds are clear and equal bilaterally. No wheezes/rales/rhonchi. Gastrointestinal: Soft and non tender. No rebound. No guarding.  Genitourinary: Deferred Musculoskeletal: Normal range of motion in all extremities. No lower extremity edema. Neurologic:  Normal speech and language. No gross focal neurologic deficits are appreciated.  Skin:  Skin is warm, dry and intact. No rash noted. Psychiatric: Mood and affect are normal. Speech and behavior are normal. Patient exhibits appropriate insight and judgment.  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   Patient presented to the emergency department today because of concerns for bad headache.  The time my exam he stated that his headache felt better.  No focal neuro deficits on exam.  Initial CT scan was negative for acute bleed however did show possible calcified aneurysm.  Did get a CT angio head and neck which did show a subcentimeter aneurysm.  Discussed with Dr. Izora Ribas with neurosurgery.  Recommended MRI versus LP to further evaluate.  In discussion with the patient he declined LP so MRI was obtained.  This did not show any acute bleed.  It also did not show any stroke or other acute abnormality.  At this time will discharge patient home to follow-up with neurosurgery. ____________________________________________   FINAL CLINICAL IMPRESSION(S) / ED DIAGNOSES  Final diagnoses:  Bad headache  Aneurysm ([place])     Note: This dictation was prepared with Dragon dictation. Any transcriptional errors that result from this process are unintentional  Nance Pear, MD 12/01/20 2113

## 2020-12-01 NOTE — ED Notes (Signed)
MRI on phone to do screening- pt given warm blankets

## 2020-12-01 NOTE — ED Triage Notes (Signed)
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 A/ox4. Denies any numbness or tingling, denies any issues with balance.Marland Kitchen

## 2020-12-01 NOTE — Discharge Instructions (Addendum)
It is very important that you follow up with Neurosurgery. Please return for any further headache, nausea or vomiting, change in vision or speech, weakness or numbness in your arms or legs or any other new or concerning symptoms.

## 2020-12-01 NOTE — ED Notes (Signed)
Pt given TV remote

## 2020-12-14 HISTORY — PX: EMBOLIZATION: SHX1496

## 2021-04-13 ENCOUNTER — Other Ambulatory Visit: Payer: Self-pay | Admitting: General Surgery

## 2021-04-13 NOTE — Progress Notes (Signed)
Subjective:     Patient ID: Ethan Dyer is a 70 y.o. male.   HPI   The following portions of the patient's history were reviewed and updated as appropriate.   This a new patient is here today for: office visit. He is here for evaluation of a possible left inguinal hernia. He states he has had left groin discomfort for about 3-4 weeks on and off. He states he has a knot but he can "push it back in".  Bowels move regular, no bleeding. The patient reports being diagnosed with esophageal cancer treated by surgery, chemotherapy and radiation in the distant past.  He has required intermittent dilatations over the years for benign strictures. He does admit to having trouble swallowing.  He admits to a 7 pound weight loss in 3 months.  Most of this relates to his modest dysphagia. He has a upper and lower endoscopy scheduled for July 5 with Dr Michail Sermon.   The patient recalls that I provided care for his wife, Levin Bacon, in the past.       Chief Complaint  Patient presents with   Hernia        Vitals     Vitals:    04/06/21 1502  BP: (!) 142/76  Pulse: 80  Temp: 36.5 C (97.7 F)  SpO2: 98%  Weight: 73 kg (161 lb)  Height: 180.3 cm (5\' 11" )        Past Medical History      Past Medical History:  Diagnosis Date   Adenoma of large intestine, unspecified     Aneurysm of left vertebral artery (CMS-HCC) 2022   GERD (gastroesophageal reflux disease)     History of cancer      esophageal   Hyperlipidemia     Hypertension     Kidney disease      stage IIIa   Kidney stone     Nephrolithiasis        Past Surgical History       Past Surgical History:  Procedure Laterality Date   ESOPHAGOGASTRECTOMY TRANSHIATAL   2003   INGUINAL HERNIA REPAIR Right     knee surgery Left     LITHOTRIPSY   02/11/2020      Social History  Social History         Socioeconomic History   Marital status: Life Partner  Tobacco Use   Smoking status: Former Smoker      Quit date:  03/18/1988      Years since quitting: 33.0   Smokeless tobacco: Never Used  Vaping Use   Vaping Use: Never used  Substance and Sexual Activity   Alcohol use: Yes      Alcohol/week: 3.0 standard drinks      Types: 3 Cans of beer per week   Drug use: Never   Sexual activity: Yes        Current Outpatient Medications:    aspirin 81 MG EC tablet, Take 1 tablet (81 mg total) by mouth once daily, Disp: 30 tablet, Rfl: 11   fexofenadine (ALLEGRA) 180 MG tablet, Take 180 mg by mouth once daily, Disp: , Rfl:    omeprazole (PRILOSEC) 40 MG DR capsule, Take 1 capsule by mouth once daily   , Disp: , Rfl:    tamsulosin (FLOMAX) 0.4 mg capsule, Take 0.4 mg by mouth once daily, Disp: , Rfl:    ticagrelor (BRILINTA) 90 mg tablet, Take 1 tablet (90 mg total) by mouth every 12 (twelve) hours, Disp: 60 tablet,  Rfl: 2    Allergies      Allergies  Allergen Reactions   Nucynta [Tapentadol] Hallucination            Review of Systems  Constitutional: Positive for unexpected weight change. Negative for chills and fatigue.  Respiratory: Negative for cough.   Gastrointestinal: Negative for anal bleeding, blood in stool and constipation.    Laboratory: December 14, 2020   Component Ref Range & Units 3 mo ago   WBC (White Blood Cell Count) 3.2 - 9.8 x10^9/L 4.5   Hemoglobin 13.7 - 17.3 g/dL 13.1 Low    Hematocrit 39.0 - 49.0 % 38.7 Low    Platelets 150 - 450 x10^9/L 179   MCV (Mean Corpuscular Volume) 80 - 98 fL 90   MCH (Mean Corpuscular Hemoglobin) 26.5 - 34.0 pg 30.5   MCHC (Mean Corpuscular Hemoglobin Concentration) 31.5 - 36.3 % 33.9   RBC (Red Blood Cell Count) 4.37 - 5.74 x10^12/L 4.29 Low    RDW-CV (Red Cell Distribution Width) 11.5 - 14.5 % 13.1   NRBC (Nucleated Red Blood Cell Count) 0 x10^9/L 0.00   NRBC % (Nucleated Red Blood Cell %) % 0.0   MPV (Mean Platelet Volume) 7.2 - 11.7 fL 10.1   Slide Review/Morphology   Yes     Component Ref Range & Units 3 mo ago   Sodium 135 - 145 mmol/L  138   Potassium 3.5 - 5.0 mmol/L 3.9   Chloride 98 - 108 mmol/L 106   Carbon Dioxide (CO2) 21 - 30 mmol/L 25   Urea Nitrogen (BUN) 7 - 20 mg/dL 12   Creatinine 0.6 - 1.3 mg/dL 1.4 High    Glucose 70 - 140 mg/dL 100        Objective:   Physical Exam Cardiovascular:     Rate and Rhythm: Normal rate and regular rhythm.     Pulses: Normal pulses.     Heart sounds: Normal heart sounds.  Pulmonary:     Effort: Pulmonary effort is normal.     Breath sounds: Normal breath sounds.  Abdominal:     General: Abdomen is flat.     Hernia: A hernia is present. Hernia is present in the left inguinal area. There is no hernia in the right inguinal area.  Genitourinary:      Comments: Visible groin bulge in the standing position.  Reducible. Musculoskeletal:     Cervical back: Normal range of motion and neck supple.  Lymphadenopathy:     Cervical:     Right cervical: No superficial cervical adenopathy.    Left cervical: No superficial cervical adenopathy.     Lower Body: No right inguinal adenopathy. No left inguinal adenopathy.  Neurological:     Mental Status: He is alert.          Assessment:     Symptomatic left inguinal hernia.    Plan:     The patient is a good candidate for elective repair.  Role of prosthetic mesh reviewed.  Risks associated with surgery discussed including infection.   I suspect we will find that his mild weight loss is secondary to his dysphagia from a benign stricture.   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   This note is partially prepared by Karie Fetch, RN, acting as a scribe in the presence of Dr. Hervey Ard, MD.  The documentation recorded by the scribe accurately reflects the service I personally  performed and the decisions made by me.    Robert Bellow, MD FACS

## 2021-04-19 ENCOUNTER — Other Ambulatory Visit: Payer: Self-pay

## 2021-04-19 ENCOUNTER — Encounter
Admission: RE | Admit: 2021-04-19 | Discharge: 2021-04-19 | Disposition: A | Discharge status: Home / Self Care | Payer: 59 | Source: Ambulatory Visit | Attending: General Surgery | Admitting: General Surgery

## 2021-04-19 HISTORY — DX: Personal history of urinary calculi: Z87.442

## 2021-04-19 HISTORY — DX: Chronic kidney disease, unspecified: N18.9

## 2021-04-19 NOTE — Patient Instructions (Signed)
Your procedure is scheduled on:04-24-21 Monday Report to the Registration Desk on the 1st floor of the [facility].Then proceed to the 2nd floor Surgery Desk in the [facility] To find out your arrival time, please call [phone] between 1PM - 3PM on:04-21-21 Friday  REMEMBER: Instructions that are not followed completely may result in serious medical risk, up to and including death; or upon the discretion of your surgeon and anesthesiologist your surgery may need to be rescheduled.  Do not eat food after midnight the night before surgery.  No gum chewing, lozengers or hard candies.  You may however, drink CLEAR liquids up to 2 hours before you are scheduled to arrive for your surgery. Do not drink anything within 2 hours of your scheduled arrival time.  Clear liquids include: - water  - apple juice without pulp - gatorade (not RED, PURPLE, OR BLUE) - black coffee or tea (Do NOT add milk or creamers to the coffee or tea) Do NOT drink anything that is not on this list.  TAKE THESE MEDICATIONS THE MORNING OF SURGERY WITH A SIP OF WATER: -Prilosec (Omeprazole)-take one the night before and one on the morning of surgery - helps to prevent nausea after surgery.)  Call Dr Dwyane Luo office today (04-19-21) and find out if your 81 mg Aspirin needs to be stopped and if so when  One week prior to surgery: Stop Anti-inflammatories (NSAIDS) such as Advil, Aleve, Ibuprofen, Motrin, Naproxen, Naprosyn and Aspirin based products such as Excedrin, Goodys Powder, BC Powder.You may however, continue to take Tylenol if needed for pain up until the day of surgery.  Stop ANY OVER THE COUNTER supplements/vitamins NOW (04-19-21) until after surgery.  No Alcohol for 24 hours before or after surgery.  No Smoking including e-cigarettes for 24 hours prior to surgery.  No chewable tobacco products for at least 6 hours prior to surgery.  No nicotine patches on the day of surgery.  Do not use any  "recreational" drugs for at least a week prior to your surgery.  Please be advised that the combination of cocaine and anesthesia may have negative outcomes, up to and including death. If you test positive for cocaine, your surgery will be cancelled.  On the morning of surgery brush your teeth with toothpaste and water, you may rinse your mouth with mouthwash if you wish. Do not swallow any toothpaste or mouthwash.  Do not wear jewelry, make-up, hairpins, clips or nail polish.  Do not wear lotions, powders, or perfumes.   Do not shave body from the neck down 48 hours prior to surgery just in case you cut yourself which could leave a site for infection.  Also, freshly shaved skin may become irritated if using the CHG soap.  Contact lenses, hearing aids and dentures may not be worn into surgery.  Do not bring valuables to the hospital. [facility] is not responsible for any missing/lost belongings or valuables.   Use CHG Soap as directed on instruction sheet.  Notify your doctor if there is any change in your medical condition (cold, fever, infection).  Wear comfortable clothing (specific to your surgery type) to the hospital.  After surgery, you can help prevent lung complications by doing breathing exercises.  Take deep breaths and cough every 1-2 hours. Your doctor may order a device called an Incentive Spirometer to help you take deep breaths. When coughing or sneezing, hold a pillow firmly against your incision with both hands. This is called "splinting." Doing this helps protect  your incision. It also decreases belly discomfort.  If you are being admitted to the hospital overnight, leave your suitcase in the car. After surgery it may be brought to your room.  If you are being discharged the day of surgery, you will not be allowed to drive home. You will need a responsible adult (18 years or older) to drive you home and stay with you that night.   If you are taking public  transportation, you will need to have a responsible adult (18 years or older) with you. Please confirm with your physician that it is acceptable to use public transportation.   Please call the [facility] at [phone] if you have any questions about these instructions.  Surgery Visitation Policy:  Patients undergoing a surgery or procedure may have one family member or support person with them as long as that person is not COVID-19 positive or experiencing its symptoms.  That person may remain in the waiting area during the procedure.  Inpatient Visitation:    Visiting hours are 7 a.m. to 8 p.m. Inpatients will be allowed two visitors daily. The visitors may change each day during the patient's stay. No visitors under the age of 82. Any visitor under the age of 14 must be accompanied by an adult. The visitor must pass COVID-19 screenings, use hand sanitizer when entering and exiting the patient's room and wear a mask at all times, including in the patient's room. Patients must also wear a mask when staff or their visitor are in the room. Masking is required regardless of vaccination status.

## 2021-04-21 NOTE — Progress Notes (Signed)
  Perioperative Services Pre-Admission/Anesthesia Testing    Date: 04/21/21  Name: Ethan Dyer MRN:   [id number]  Re: Presurgical neurology clearance   Case: [id number] Date/Time: 04/24/21 1222   Procedure: HERNIA REPAIR INGUINAL ADULT (Left)   Anesthesia type: General   Pre-op diagnosis: left inguinal hernia   Location: [facility] OR ROOM 08 / [place] ORS FOR ANESTHESIA GROUP   Surgeons: Robert Bellow, MD     Patient scheduled for the above procedure on 04/24/2021 with Dr. Hervey Ard, MD.  Patient recently underwent a vertebral artery aneurysm repair on 12/13/2020 at [facility].  Postoperatively, patient on DAPT therapy (ticagrelor + ASA) x 3 months.  Ticagrelor has since been discontinued, and patient is only taking daily low-dose ASA at this time.  In review of the neurology notes, plans are for DSA imaging at the 4 to 60-month interval.  He is scheduled to follow-up with neurology in 1 month.  Given patient's recent surgical intervention for aneurysm repair, presurgical clearance was sought from neurology/neurosurgery.  Per neurology, patient is optimized for surgery and may proceed with an overall LOW risk for significant perioperative complications at this time.  Strict control of blood pressure during the perioperative period recommended".  Impression and Plan:  [name] has been referred for pre-anesthesia review and clearance prior to him undergoing the planned anesthetic and procedural courses. Available labs, pertinent testing, and imaging results were personally reviewed by me. This patient has been appropriately cleared by neurology with an overall LOW risk of significant perioperative complications.  Based on clinical review performed today (04/21/21), barring any significant acute changes in the patient's overall condition, it is anticipated that he will be able to proceed with the planned surgical intervention. Any acute changes in clinical condition may necessitate his  procedure being postponed and/or cancelled. Patient will meet with anesthesia team (MD and/or CRNA) on this day of his procedure for preoperative evaluation/assessment.   Pre-surgical instructions were reviewed with the patient during his PAT appointment and questions were fielded by PAT clinical staff. Patient was advised that if any questions or concerns arise prior to his procedure then he should return a call to PAT and/or his surgeon's office to discuss.  Honor Loh, MSN, APRN, FNP-C, CEN [facility]  Peri-operative Services Nurse Practitioner Phone: [phone] 04/21/21 10:50 AM  NOTE: This note has been prepared using Dragon dictation software. Despite my best ability to proofread, there is always the potential that unintentional transcriptional errors may still occur from this process.

## 2021-04-24 ENCOUNTER — Other Ambulatory Visit: Payer: Self-pay

## 2021-04-24 ENCOUNTER — Encounter: Payer: Self-pay | Admitting: General Surgery

## 2021-04-24 ENCOUNTER — Ambulatory Visit
Admission: RE | Admit: 2021-04-24 | Discharge: 2021-04-24 | Disposition: A | Discharge status: Home / Self Care | Payer: Medicare Other | Attending: General Surgery | Admitting: General Surgery

## 2021-04-24 ENCOUNTER — Ambulatory Visit: Payer: Medicare Other | Admitting: Urgent Care

## 2021-04-24 ENCOUNTER — Encounter
Admission: RE | Disposition: A | Discharge status: Home / Self Care | Payer: Self-pay | Source: Home / Self Care | Attending: General Surgery

## 2021-04-24 DIAGNOSIS — Z79899 Other long term (current) drug therapy: Secondary | ICD-10-CM | POA: Diagnosis not present

## 2021-04-24 DIAGNOSIS — N183 Chronic kidney disease, stage 3 unspecified: Secondary | ICD-10-CM | POA: Insufficient documentation

## 2021-04-24 DIAGNOSIS — Z87442 Personal history of urinary calculi: Secondary | ICD-10-CM | POA: Insufficient documentation

## 2021-04-24 DIAGNOSIS — K4090 Unilateral inguinal hernia, without obstruction or gangrene, not specified as recurrent: Secondary | ICD-10-CM | POA: Insufficient documentation

## 2021-04-24 DIAGNOSIS — K219 Gastro-esophageal reflux disease without esophagitis: Secondary | ICD-10-CM | POA: Insufficient documentation

## 2021-04-24 DIAGNOSIS — Z87891 Personal history of nicotine dependence: Secondary | ICD-10-CM | POA: Insufficient documentation

## 2021-04-24 DIAGNOSIS — Z7982 Long term (current) use of aspirin: Secondary | ICD-10-CM | POA: Insufficient documentation

## 2021-04-24 HISTORY — PX: INGUINAL HERNIA REPAIR: SHX194

## 2021-04-24 HISTORY — PX: INSERTION OF MESH: SHX5868

## 2021-04-24 SURGERY — REPAIR, HERNIA, INGUINAL, ADULT
Anesthesia: General | Laterality: Left

## 2021-04-24 MED ORDER — CHLORHEXIDINE GLUCONATE 0.12 % MT SOLN: 15.0000 mL | Freq: Once | OROMUCOSAL | Status: AC

## 2021-04-24 MED ORDER — CHLORHEXIDINE GLUCONATE CLOTH 2 % EX PADS
6.0000 | MEDICATED_PAD | Freq: Once | CUTANEOUS | Status: AC
  Administered 2021-04-24: 6 via TOPICAL

## 2021-04-24 MED ORDER — ONDANSETRON HCL 4 MG/2ML IJ SOLN: 4.0000 mg | Freq: Once | INTRAMUSCULAR | Status: DC | PRN

## 2021-04-24 MED ORDER — EPHEDRINE 5 MG/ML INJ
INTRAVENOUS | Status: AC
  Filled 2021-04-24: qty 10

## 2021-04-24 MED ORDER — LACTATED RINGERS IV SOLN: INTRAVENOUS | Status: DC

## 2021-04-24 MED ORDER — FENTANYL CITRATE (PF) 100 MCG/2ML IJ SOLN: 25.0000 ug | INTRAMUSCULAR | Status: DC | PRN

## 2021-04-24 MED ORDER — PROPOFOL 10 MG/ML IV BOLUS
INTRAVENOUS | Status: AC
  Filled 2021-04-24: qty 20

## 2021-04-24 MED ORDER — ACETAMINOPHEN 10 MG/ML IV SOLN
INTRAVENOUS | Status: AC
  Filled 2021-04-24: qty 100

## 2021-04-24 MED ORDER — BUPIVACAINE-EPINEPHRINE (PF) 0.25% -1:200000 IJ SOLN
INTRAMUSCULAR | Status: AC
  Filled 2021-04-24: qty 30

## 2021-04-24 MED ORDER — ACETAMINOPHEN 10 MG/ML IV SOLN
INTRAVENOUS | Status: DC | PRN
  Administered 2021-04-24: 1000 mg via INTRAVENOUS

## 2021-04-24 MED ORDER — MIDAZOLAM HCL 2 MG/2ML IJ SOLN
INTRAMUSCULAR | Status: AC
  Filled 2021-04-24: qty 2

## 2021-04-24 MED ORDER — CEFAZOLIN SODIUM-DEXTROSE 2-4 GM/100ML-% IV SOLN
INTRAVENOUS | Status: AC
  Filled 2021-04-24: qty 100

## 2021-04-24 MED ORDER — PROPOFOL 10 MG/ML IV BOLUS
INTRAVENOUS | Status: DC | PRN
  Administered 2021-04-24: 180 mg via INTRAVENOUS

## 2021-04-24 MED ORDER — FENTANYL CITRATE (PF) 250 MCG/5ML IJ SOLN
INTRAMUSCULAR | Status: AC
  Filled 2021-04-24: qty 5

## 2021-04-24 MED ORDER — EPHEDRINE SULFATE 50 MG/ML IJ SOLN
INTRAMUSCULAR | Status: DC | PRN
  Administered 2021-04-24: 5 mg via INTRAVENOUS

## 2021-04-24 MED ORDER — FENTANYL CITRATE (PF) 100 MCG/2ML IJ SOLN
INTRAMUSCULAR | Status: DC | PRN
  Administered 2021-04-24: 50 ug via INTRAVENOUS
  Administered 2021-04-24 (×3): 25 ug via INTRAVENOUS

## 2021-04-24 MED ORDER — LIDOCAINE HCL (CARDIAC) PF 100 MG/5ML IV SOSY
PREFILLED_SYRINGE | INTRAVENOUS | Status: DC | PRN
  Administered 2021-04-24: 80 mg via INTRAVENOUS

## 2021-04-24 MED ORDER — CHLORHEXIDINE GLUCONATE 0.12 % MT SOLN
OROMUCOSAL | Status: AC
  Administered 2021-04-24: 15 mL via OROMUCOSAL
  Filled 2021-04-24: qty 15

## 2021-04-24 MED ORDER — OXYCODONE HCL 5 MG PO TABS
5.0000 mg | ORAL_TABLET | Freq: Once | ORAL | Status: DC | PRN
Start: 2021-04-24 — End: 2021-04-24

## 2021-04-24 MED ORDER — DEXAMETHASONE SODIUM PHOSPHATE 10 MG/ML IJ SOLN
INTRAMUSCULAR | Status: DC | PRN
  Administered 2021-04-24: 10 mg via INTRAVENOUS

## 2021-04-24 MED ORDER — CHLORHEXIDINE GLUCONATE CLOTH 2 % EX PADS: 6.0000 | MEDICATED_PAD | Freq: Once | CUTANEOUS | Status: DC

## 2021-04-24 MED ORDER — HYDROCODONE-ACETAMINOPHEN 5-325 MG PO TABS: 1.0000 | ORAL_TABLET | ORAL | 0 refills | Status: AC | PRN

## 2021-04-24 MED ORDER — ORAL CARE MOUTH RINSE: 15.0000 mL | Freq: Once | OROMUCOSAL | Status: AC

## 2021-04-24 MED ORDER — ONDANSETRON HCL 4 MG/2ML IJ SOLN
INTRAMUSCULAR | Status: DC | PRN
  Administered 2021-04-24: 4 mg via INTRAVENOUS

## 2021-04-24 MED ORDER — MIDAZOLAM HCL 2 MG/2ML IJ SOLN
INTRAMUSCULAR | Status: DC | PRN
  Administered 2021-04-24: 2 mg via INTRAVENOUS

## 2021-04-24 MED ORDER — BUPIVACAINE-EPINEPHRINE (PF) 0.5% -1:200000 IJ SOLN
INTRAMUSCULAR | Status: DC | PRN
  Administered 2021-04-24: 30 mL

## 2021-04-24 MED ORDER — OXYCODONE HCL 5 MG/5ML PO SOLN: 5.0000 mg | Freq: Once | ORAL | Status: DC | PRN

## 2021-04-24 MED ORDER — KETOROLAC TROMETHAMINE 30 MG/ML IJ SOLN
INTRAMUSCULAR | Status: AC
  Filled 2021-04-24: qty 1

## 2021-04-24 MED ORDER — 0.9 % SODIUM CHLORIDE (POUR BTL) OPTIME
TOPICAL | Status: DC | PRN
  Administered 2021-04-24: 500 mL

## 2021-04-24 MED ORDER — CEFAZOLIN SODIUM-DEXTROSE 2-4 GM/100ML-% IV SOLN
2.0000 g | INTRAVENOUS | Status: AC
  Administered 2021-04-24: 2 g via INTRAVENOUS

## 2021-04-24 MED ORDER — BUPIVACAINE-EPINEPHRINE (PF) 0.5% -1:200000 IJ SOLN
INTRAMUSCULAR | Status: AC
  Filled 2021-04-24: qty 30

## 2021-04-24 SURGICAL SUPPLY — 38 items
APL PRP STRL LF DISP 70% ISPRP (MISCELLANEOUS) ×1
BLADE CLIPPER SURG (BLADE) ×1 IMPLANT
BLADE SURG 15 STRL SS SAFETY (BLADE) ×4 IMPLANT
CANISTER SUCT 1200ML W/VALVE (MISCELLANEOUS) ×1 IMPLANT
CHLORAPREP W/TINT 26 (MISCELLANEOUS) ×2 IMPLANT
DECANTER SPIKE VIAL GLASS SM (MISCELLANEOUS) ×2 IMPLANT
DRAIN PENROSE 12X.25 LTX STRL (MISCELLANEOUS) ×2 IMPLANT
DRAPE LAPAROTOMY 100X77 ABD (DRAPES) ×2 IMPLANT
DRSG TEGADERM 4X4.75 (GAUZE/BANDAGES/DRESSINGS) ×2 IMPLANT
DRSG TELFA 4X3 1S NADH ST (GAUZE/BANDAGES/DRESSINGS) ×2 IMPLANT
ELECT CAUTERY BLADE 6.4 (BLADE) ×1 IMPLANT
ELECT REM PT RETURN 9FT ADLT (ELECTROSURGICAL) ×2
ELECTRODE REM PT RTRN 9FT ADLT (ELECTROSURGICAL) ×1 IMPLANT
GAUZE 4X4 16PLY ~~LOC~~+RFID DBL (SPONGE) ×2 IMPLANT
GLOVE SURG ENC MOIS LTX SZ7.5 (GLOVE) ×2 IMPLANT
GLOVE SURG UNDER LTX SZ8 (GLOVE) ×2 IMPLANT
GOWN STRL REUS W/ TWL LRG LVL3 (GOWN DISPOSABLE) ×2 IMPLANT
GOWN STRL REUS W/TWL LRG LVL3 (GOWN DISPOSABLE) ×4
KIT TURNOVER KIT A (KITS) ×2 IMPLANT
LABEL OR SOLS (LABEL) ×2 IMPLANT
MANIFOLD NEPTUNE II (INSTRUMENTS) ×2 IMPLANT
MESH HERNIA 6X12 ULTRAPRO MED (Mesh General) ×1 IMPLANT
MESH HERNIA ULTRAPRO MED (Mesh General) ×1 IMPLANT
NEEDLE HYPO 22GX1.5 SAFETY (NEEDLE) ×3 IMPLANT
PACK BASIN MINOR ARMC (MISCELLANEOUS) ×2 IMPLANT
STRIP CLOSURE SKIN 1/2X4 (GAUZE/BANDAGES/DRESSINGS) ×2 IMPLANT
SUT PDS AB 0 CT1 27 (SUTURE) ×2 IMPLANT
SUT SURGILON 0 BLK (SUTURE) ×3 IMPLANT
SUT VIC AB 2-0 SH 27 (SUTURE) ×2
SUT VIC AB 2-0 SH 27XBRD (SUTURE) ×1 IMPLANT
SUT VIC AB 3-0 54X BRD REEL (SUTURE) ×1 IMPLANT
SUT VIC AB 3-0 BRD 54 (SUTURE) ×2
SUT VIC AB 3-0 SH 27 (SUTURE) ×2
SUT VIC AB 3-0 SH 27X BRD (SUTURE) ×1 IMPLANT
SUT VIC AB 4-0 FS2 27 (SUTURE) ×2 IMPLANT
SWABSTK COMLB BENZOIN TINCTURE (MISCELLANEOUS) ×2 IMPLANT
SYR 10ML LL (SYRINGE) ×2 IMPLANT
SYR 3ML LL SCALE MARK (SYRINGE) ×2 IMPLANT

## 2021-04-24 NOTE — Op Note (Signed)
Preoperative diagnosis: Symptomatic left inguinal hernia.  Postoperative diagnosis: Same.  Operative procedure: Left inguinal hernia repair with medium Ultra Pro mesh.  Operating Surgeon: Hervey Ard, MD.  Anesthesia: General by LMA, Marcaine 0.5% with 1: 200,000 units of epinephrine, 30 cc, Toradol 30 mg.  Estimated blood loss: Less than 2 cc.  Clinical note: This 70 year old male developed a symptomatic left inguinal hernia.  He was admitted for elective repair.  He had Ancef intravenously prior to the procedure.  SCD stockings for DVT prevention.  Operative note: The patient underwent general anesthesia and tolerated this well.  The left groin was clipped and the skin cleansed with ChloraPrep.  Field block anesthesia was established.  A 5 cm skin line incision along the anticipated course the inguinal canal was carried down through skin subtendinous tissue with hemostasis achieved electrocautery.  The external oblique was opened in the direction of its fibers.  The ilioinguinal nerve was identified.  A small vessel adjacent to it was controlled with a 3-0 Vicryl tie.  This was mobilized out of the field.  The iliohypogastric nerve was out of the field of dissection.  The cord was mobilized and a lipoma the cord excised with hemostasis by 3-0 Vicryl tie.  The hernia sac was dissected free from the adjacent cord structures.  This was then reduced into the preperitoneal space.  The preperitoneal space was cleared and a medium Ultra Pro mesh smoothed into position.  The external component was anchored to the pubic tubercle with 0 Surgilon.  Interrupted sutures in a similar suture were placed along the inguinal ligament.  Medial and superior borders were cut anchored to the transverse abdominis aponeurosis.  A lateral slit was made for cord passage and closed.  Toradol was placed in the wound.  The nerves were returned to their bed.  The external oblique was closed with a running 2-0 Vicryl suture.   Scarpa's fascia was closed with a running 3-0 Vicryl suture.  The skin closed with a running 4-0 Vicryl subcuticular suture.  Benzoin, Steri-Strips, Telfa and Tegaderm dressing was applied.  Patient tolerated procedure well was taken recovery room in stable condition.

## 2021-04-24 NOTE — H&P (Signed)
Hunner Garcon [id number] [birth year]     HPI: Healthy 70 y/o male with a symptomatic left inguinal hernia.  For repair with prosthetic mesh.   Medications Prior to Admission  Medication Sig Dispense Refill Last Dose   aspirin EC 81 MG tablet Take 81 mg by mouth daily. Swallow whole.   04/23/2021   omeprazole (PRILOSEC) 40 MG capsule Take 40 mg by mouth 2 (two) times daily.   04/23/2021   sucralfate (CARAFATE) 1 g tablet Take 1 g by mouth 3 (three) times daily.   04/23/2021   tamsulosin (FLOMAX) 0.4 MG CAPS capsule Take 1 capsule (0.4 mg total) by mouth daily. (Patient taking differently: Take 0.4 mg by mouth daily after supper.) 30 capsule 11 04/23/2021   No Known Allergies Past Medical History:  Diagnosis Date   Adenoma of large intestine    Aneurysm ([place]) 12/01/2020   vertebrobasilar junction   Chronic kidney disease    stage 3   Degenerative joint disease of spine    Gastroesophageal cancer ([place]) 2008   GERD (gastroesophageal reflux disease)    History of kidney stones    Hyperlipidemia    Past Surgical History:  Procedure Laterality Date   EMBOLIZATION Left 12/14/2020   cervicocerebral angiogram with pipeline embolization of left vertebral artery aneurysm   ESOPHAGOGASTRECTOMY  04/20/2002   Ivor-Lewis esophagogastrectomy   EXTRACORPOREAL SHOCK WAVE LITHOTRIPSY Right 02/11/2020   Procedure: EXTRACORPOREAL SHOCK WAVE LITHOTRIPSY (ESWL);  Surgeon: Royston Cowper, MD;  Location: [facility] ORS;  Service: Urology;  Laterality: Right;   KNEE SURGERY Left    Social History   Socioeconomic History   Marital status: Married    Spouse name: Not on file   Number of children: Not on file   Years of education: Not on file   Highest education level: Not on file  Occupational History   Occupation: Retired    Fish farm manager: [employer]  Tobacco Use   Smoking status: Former    Packs/day: 0.25    Years: 12.00    Pack years: 3.00    Types: Cigarettes    Quit date: 1980    Years since  quitting: 42.5   Smokeless tobacco: Never  Vaping Use   Vaping Use: Never used  Substance and Sexual Activity   Alcohol use: Yes    Alcohol/week: 1.0 standard drink    Types: 1 Standard drinks or equivalent per week    Comment: occ beer   Drug use: No   Sexual activity: Not on file  Other Topics Concern   Not on file  Social History Narrative   Not on file   Social Determinants of Health   Financial Resource Strain: Not on file  Food Insecurity: Not on file  Transportation Needs: Not on file  Physical Activity: Not on file  Stress: Not on file  Social Connections: Not on file  Intimate Partner Violence: Not on file   Social History   Social History Narrative   Not on file     ROS: Negative.     PE: HEENT: Negative. Lungs: Clear. Cardio: RR. Assessment/Plan:  Proceed with planned left inguinal hernia repair.   Forest Gleason [facility] 04/24/2021

## 2021-04-24 NOTE — Anesthesia Procedure Notes (Signed)
Procedure Name: LMA Insertion Date/Time: 04/24/2021 12:40 PM Performed by: Arita Miss, MD Pre-anesthesia Checklist: Patient identified, Patient being monitored, Timeout performed, Emergency Drugs available and Suction available Patient Re-evaluated:Patient Re-evaluated prior to induction Oxygen Delivery Method: Circle system utilized Preoxygenation: Pre-oxygenation with 100% oxygen Induction Type: IV induction Ventilation: Mask ventilation without difficulty LMA: LMA inserted LMA Size: 4.5 Number of attempts: 1 Placement Confirmation: positive ETCO2 and breath sounds checked- equal and bilateral Tube secured with: Tape Dental Injury: Teeth and Oropharynx as per pre-operative assessment

## 2021-04-24 NOTE — Discharge Instructions (Signed)

## 2021-04-24 NOTE — Anesthesia Preprocedure Evaluation (Signed)
Anesthesia Evaluation  Patient identified by MRN, date of birth, ID band Patient awake    Reviewed: Allergy & Precautions, NPO status , Patient's Chart, lab work & pertinent test results  History of Anesthesia Complications Negative for: history of anesthetic complications  Airway Mallampati: II  TM Distance: >3 FB Neck ROM: Full    Dental no notable dental hx. (+) Teeth Intact   Pulmonary neg pulmonary ROS, neg sleep apnea, neg COPD, Patient abstained from smoking.Not current smoker, former smoker,    Pulmonary exam normal breath sounds clear to auscultation       Cardiovascular Exercise Tolerance: Good METS(-) hypertension(-) CAD and (-) Past MI negative cardio ROS  (-) dysrhythmias  Rhythm:Regular Rate:Normal - Systolic murmurs    Neuro/Psych S/p cerebral aneurysm repair (IR) in 12/2020. Cleared by neuro negative psych ROS   GI/Hepatic GERD  Medicated and Controlled,(+)     (-) substance abuse  ,   Endo/Other  neg diabetes  Renal/GU CRFRenal disease     Musculoskeletal   Abdominal   Peds  Hematology   Anesthesia Other Findings Past Medical History: No date: Adenoma of large intestine 12/01/2020: Aneurysm ([place])     Comment:  vertebrobasilar junction No date: Chronic kidney disease     Comment:  stage 3 No date: Degenerative joint disease of spine 2008: Gastroesophageal cancer ([facility]) No date: GERD (gastroesophageal reflux disease) No date: History of kidney stones No date: Hyperlipidemia  Reproductive/Obstetrics                             Anesthesia Physical Anesthesia Plan  ASA: 2  Anesthesia Plan: General   Post-op Pain Management:    Induction: Intravenous  PONV Risk Score and Plan: 2 and Ondansetron and Dexamethasone  Airway Management Planned: Oral ETT  Additional Equipment: None  Intra-op Plan:   Post-operative Plan: Extubation in OR  Informed Consent: I  have reviewed the patients History and Physical, chart, labs and discussed the procedure including the risks, benefits and alternatives for the proposed anesthesia with the patient or authorized representative who has indicated his/her understanding and acceptance.     Dental advisory given  Plan Discussed with: CRNA and Surgeon  Anesthesia Plan Comments: (Discussed risks of anesthesia with patient, including PONV, sore throat, lip/dental damage. Rare risks discussed as well, such as cardiorespiratory and neurological sequelae. Patient understands.)        Anesthesia Quick Evaluation

## 2021-04-24 NOTE — Transfer of Care (Signed)
Immediate Anesthesia Transfer of Care Note  Patient: Ethan Dyer  Procedure(s) Performed: HERNIA REPAIR INGUINAL ADULT (Left)  Patient Location: PACU  Anesthesia Type:General  Level of Consciousness: drowsy  Airway & Oxygen Therapy: Patient Spontanous Breathing and Patient connected to face mask oxygen  Post-op Assessment: Report given to RN and Post -op Vital signs reviewed and stable  Post vital signs: Reviewed and stable  Last Vitals:  Vitals Value Taken Time  BP 104/71   Temp    Pulse 69   Resp 14   SpO2 100     Last Pain:  Vitals:   04/24/21 1124  TempSrc: Temporal  PainSc: 0-No pain         Complications: No notable events documented.

## 2021-04-24 NOTE — Anesthesia Postprocedure Evaluation (Signed)
Anesthesia Post Note  Patient: Ethan Dyer  Procedure(s) Performed: HERNIA REPAIR INGUINAL ADULT (Left)  Patient location during evaluation: PACU Anesthesia Type: General Level of consciousness: awake and alert Pain management: pain level controlled Vital Signs Assessment: post-procedure vital signs reviewed and stable Respiratory status: spontaneous breathing, nonlabored ventilation, respiratory function stable and patient connected to nasal cannula oxygen Cardiovascular status: blood pressure returned to baseline and stable Postop Assessment: no apparent nausea or vomiting Anesthetic complications: no   No notable events documented.   Last Vitals:  Vitals:   04/24/21 1404 04/24/21 1415  BP:  (!) 140/92  Pulse: 67 70  Resp: 11 18  Temp:  (!) 36.2 C  SpO2: 100% 100%    Last Pain:  Vitals:   04/24/21 1415  TempSrc: Temporal  PainSc: 0-No pain                 Arita Miss

## 2021-04-25 ENCOUNTER — Encounter: Payer: Self-pay | Admitting: General Surgery

## 2021-04-27 ENCOUNTER — Encounter: Payer: Self-pay | Admitting: General Surgery

## 2021-09-28 ENCOUNTER — Other Ambulatory Visit: Payer: Self-pay | Admitting: Internal Medicine

## 2021-09-28 ENCOUNTER — Other Ambulatory Visit (HOSPITAL_COMMUNITY): Payer: Self-pay | Admitting: Internal Medicine

## 2021-09-28 DIAGNOSIS — N1831 Chronic kidney disease, stage 3a: Secondary | ICD-10-CM

## 2021-10-04 ENCOUNTER — Ambulatory Visit
Admission: RE | Admit: 2021-10-04 | Discharge: 2021-10-04 | Disposition: A | Discharge status: Home / Self Care | Payer: Medicare Other | Source: Ambulatory Visit | Attending: Internal Medicine | Admitting: Internal Medicine

## 2021-10-04 ENCOUNTER — Other Ambulatory Visit: Payer: Self-pay

## 2021-10-04 DIAGNOSIS — N1831 Chronic kidney disease, stage 3a: Secondary | ICD-10-CM | POA: Insufficient documentation

## 2024-02-06 ENCOUNTER — Ambulatory Visit (INDEPENDENT_AMBULATORY_CARE_PROVIDER_SITE_OTHER): Admitting: Urology

## 2024-02-06 VITALS — BP 126/72 | HR 96 | Ht 71.0 in | Wt 156.0 lb

## 2024-02-06 DIAGNOSIS — N401 Enlarged prostate with lower urinary tract symptoms: Secondary | ICD-10-CM | POA: Diagnosis not present

## 2024-02-06 DIAGNOSIS — N138 Other obstructive and reflux uropathy: Secondary | ICD-10-CM

## 2024-02-06 DIAGNOSIS — N4281 Prostatodynia syndrome: Secondary | ICD-10-CM | POA: Diagnosis not present

## 2024-02-06 DIAGNOSIS — R399 Unspecified symptoms and signs involving the genitourinary system: Secondary | ICD-10-CM | POA: Diagnosis not present

## 2024-02-06 MED ORDER — TAMSULOSIN HCL 0.4 MG PO CAPS: 0.4000 mg | ORAL_CAPSULE | Freq: Every day | ORAL | 11 refills | Status: AC

## 2024-02-06 NOTE — Patient Instructions (Signed)

## 2024-02-06 NOTE — Progress Notes (Signed)
   02/06/24 1:52 PM   Gardner Jun 03-28-1951 [id number]  CC: BPH and urinary symptoms, nocturia, PSA screening  HPI: 73 year old male who reportedly was previously followed by Dr. Sullivan Endow for BPH and urinary symptoms who made an appointment with us  for nocturia every 2 hours overnight.  He really denies significant urinary complaints during the day.  He reportedly was previously on a medication, he thinks this may have been Flomax  which was helpful.  He has not been on that for at least a few years.  He denies any dysuria or gross hematuria.  PSA was normal at 1.7 in December 2023.  Urinalysis today benign, PVR normal at 17ml.   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   ESOPHAGOGASTRECTOMY  04/20/2002   Ivor-Lewis esophagogastrectomy   EXTRACORPOREAL SHOCK WAVE LITHOTRIPSY Right 02/11/2020   Procedure: EXTRACORPOREAL SHOCK WAVE LITHOTRIPSY (ESWL);  Surgeon: Rea Cambridge, MD;  Location: [facility];  Service: Urology;  Laterality: Right;   INGUINAL HERNIA REPAIR Left 04/24/2021   Procedure: HERNIA REPAIR INGUINAL ADULT;  Surgeon: Marshall Skeeter, MD;  Location: [facility];  Service: General;  Laterality: Left;   INSERTION OF MESH Left 04/24/2021   Procedure: INSERTION OF MESH;  Surgeon: Marshall Skeeter, MD;  Location: [facility];  Service: General;  Laterality: Left;   KNEE SURGERY Left     Family History: Family History  Problem Relation Age of Onset   Hypertension Father     Social History:   reports that he quit smoking about 45 years ago. His smoking use included cigarettes. He started smoking about 57 years ago. He has a 3 pack-year smoking history. He has never used smokeless tobacco. He reports current alcohol use of about 1.0 standard drink of alcohol per week. He reports that he does not use drugs.  Physical Exam: BP 126/72 (BP Location: Left Arm, Patient Position: Sitting, Cuff Size: Normal)   Pulse 96   Ht 5\' 11"  (1.803 m)   Wt 156 lb (70.8 kg)   BMI 21.76 kg/m    Constitutional:  Alert and oriented, No acute distress. Cardiovascular: No clubbing, cyanosis, or edema. Respiratory: Normal respiratory effort, no increased work of breathing. GI: Abdomen is soft, nontender, nondistended, no abdominal masses .  Laboratory Data: Reviewed, see HPI   Assessment & Plan:   73 year old male previously followed by Dr. Sullivan Endow for BPH and nocturia with worsening urinary symptoms primarily nocturia every 2 hours overnight with minimal complaint during the day.  PSA normal at 1.7 from last year, urinalysis today benign and PVR normal.  We reviewed the AUA guidelines and recommended trial of alpha-blocker before considering more extensive testing like cystoscopy/TRUS.  Risk and benefits were discussed.  Trial of Flomax  0.4 mg nightly RTC 6 weeks IPSS, PVR, consider cystoscopy/TRUS if refractory urinary symptoms   Jay Meth, MD 02/06/2024  [facility] [address], [address] [place], [place] [address] [phone]

## 2024-02-07 LAB — URINALYSIS, COMPLETE
Bilirubin, UA: NEGATIVE
Glucose, UA: NEGATIVE
Ketones, UA: NEGATIVE
Leukocytes,UA: NEGATIVE
Nitrite, UA: NEGATIVE
Protein,UA: NEGATIVE
RBC, UA: NEGATIVE
Specific Gravity, UA: 1.03 (ref 1.005–1.030)
Urobilinogen, Ur: 0.2 mg/dL (ref 0.2–1.0)
pH, UA: 6 (ref 5.0–7.5)

## 2024-02-07 LAB — MICROSCOPIC EXAMINATION

## 2024-03-19 ENCOUNTER — Encounter: Payer: Self-pay | Admitting: Urology

## 2024-03-19 ENCOUNTER — Ambulatory Visit: Admitting: Urology

## 2024-03-19 VITALS — BP 143/92 | HR 103 | Ht 71.0 in | Wt 156.0 lb

## 2024-03-19 DIAGNOSIS — R351 Nocturia: Secondary | ICD-10-CM

## 2024-03-19 DIAGNOSIS — N4281 Prostatodynia syndrome: Secondary | ICD-10-CM

## 2024-03-19 DIAGNOSIS — N401 Enlarged prostate with lower urinary tract symptoms: Secondary | ICD-10-CM

## 2024-03-19 DIAGNOSIS — Z125 Encounter for screening for malignant neoplasm of prostate: Secondary | ICD-10-CM

## 2024-03-19 LAB — BLADDER SCAN AMB NON-IMAGING: Scan Result: 0

## 2024-03-19 NOTE — Progress Notes (Signed)
   03/19/2024 10:15 AM   Ethan Dyer Jun [birth year] [id number]  Reason for visit: Follow up nocturia, PSA screening  HPI: 73 year old male who was previously followed by Dr. Sullivan Endow for BPH and urinary symptoms.  I originally met him in May 2025 when he was complaining of nocturia every 2 hours overnight, with no significant urinary complaints during the day.  PSA was normal at 1.7 in December 2023, urinalysis benign, PVR normal at 17 mL.  He opted for trial of Flomax .  He reports significant improvement on the Flomax .  Having nocturia just 1-2 times at night, PVR today normal at 0ml.  IPSS score 8, quality-of-life pleased.  Risks and benefits of PSA screening reviewed, due for PSA next year.  With his excellent health, reasonable to continue screening through age 89.  Continue Flomax  RTC 1 year PVR, PSA reflex to free prior  Lawerence Pressman, MD  [facility] [address], [address] [place], [place] [address] [phone]

## 2024-03-19 NOTE — Patient Instructions (Signed)

## 2024-06-10 ENCOUNTER — Encounter: Payer: Self-pay | Admitting: Urology

## 2024-07-20 ENCOUNTER — Ambulatory Visit
Admission: RE | Admit: 2024-07-20 | Discharge: 2024-07-20 | Disposition: A | Discharge status: Home / Self Care | Source: Ambulatory Visit | Attending: Internal Medicine | Admitting: Internal Medicine

## 2024-07-20 ENCOUNTER — Other Ambulatory Visit
Admission: RE | Admit: 2024-07-20 | Discharge: 2024-07-20 | Disposition: A | Discharge status: Home / Self Care | Source: Ambulatory Visit | Attending: Internal Medicine | Admitting: Internal Medicine

## 2024-07-20 ENCOUNTER — Other Ambulatory Visit: Payer: Self-pay | Admitting: Internal Medicine

## 2024-07-20 DIAGNOSIS — R0602 Shortness of breath: Secondary | ICD-10-CM | POA: Insufficient documentation

## 2024-07-20 DIAGNOSIS — M791 Myalgia, unspecified site: Secondary | ICD-10-CM | POA: Diagnosis present

## 2024-07-20 LAB — TROPONIN I (HIGH SENSITIVITY): Troponin I (High Sensitivity): 7 ng/L (ref <18)

## 2024-07-20 LAB — POCT I-STAT CREATININE: Creatinine, Ser: 2 mg/dL — ABNORMAL HIGH (ref 0.61–1.24)

## 2024-07-20 LAB — D-DIMER, QUANTITATIVE: D-Dimer, Quant: 0.65 ug{FEU}/mL — ABNORMAL HIGH (ref 0.00–0.50)

## 2024-07-20 MED ORDER — IOHEXOL 350 MG/ML SOLN
75.0000 mL | Freq: Once | INTRAVENOUS | Status: AC | PRN
  Administered 2024-07-20: 60 mL via INTRAVENOUS

## 2024-09-15 ENCOUNTER — Encounter: Payer: Self-pay | Admitting: General Surgery

## 2025-03-19 ENCOUNTER — Other Ambulatory Visit

## 2025-03-24 ENCOUNTER — Ambulatory Visit: Admitting: Urology

## 2025-03-25 ENCOUNTER — Ambulatory Visit: Admitting: Urology
